# Patient Record
Sex: Male | Born: 1960 | Race: Black or African American | Hispanic: No | Marital: Single | Smoking: Current every day smoker
Health system: Southern US, Community
[De-identification: ages and names within clinical notes are randomized; demographics above are authoritative.]

## PROBLEM LIST (undated history)

## (undated) DIAGNOSIS — F4312 Post-traumatic stress disorder, chronic: Secondary | ICD-10-CM

## (undated) DIAGNOSIS — D509 Iron deficiency anemia, unspecified: Secondary | ICD-10-CM

## (undated) DIAGNOSIS — K279 Peptic ulcer, site unspecified, unspecified as acute or chronic, without hemorrhage or perforation: Secondary | ICD-10-CM

## (undated) DIAGNOSIS — A048 Other specified bacterial intestinal infections: Secondary | ICD-10-CM

## (undated) DIAGNOSIS — M1990 Unspecified osteoarthritis, unspecified site: Secondary | ICD-10-CM

## (undated) DIAGNOSIS — F329 Major depressive disorder, single episode, unspecified: Secondary | ICD-10-CM

## (undated) DIAGNOSIS — F411 Generalized anxiety disorder: Secondary | ICD-10-CM

## (undated) DIAGNOSIS — F1410 Cocaine abuse, uncomplicated: Secondary | ICD-10-CM

## (undated) DIAGNOSIS — I10 Essential (primary) hypertension: Secondary | ICD-10-CM

## (undated) DIAGNOSIS — Z860101 Personal history of adenomatous and serrated colon polyps: Secondary | ICD-10-CM

## (undated) DIAGNOSIS — K449 Diaphragmatic hernia without obstruction or gangrene: Secondary | ICD-10-CM

## (undated) DIAGNOSIS — C61 Malignant neoplasm of prostate: Secondary | ICD-10-CM

## (undated) DIAGNOSIS — K760 Fatty (change of) liver, not elsewhere classified: Secondary | ICD-10-CM

## (undated) DIAGNOSIS — F419 Anxiety disorder, unspecified: Secondary | ICD-10-CM

## (undated) DIAGNOSIS — K219 Gastro-esophageal reflux disease without esophagitis: Secondary | ICD-10-CM

## (undated) DIAGNOSIS — Z8619 Personal history of other infectious and parasitic diseases: Secondary | ICD-10-CM

## (undated) DIAGNOSIS — Z5189 Encounter for other specified aftercare: Secondary | ICD-10-CM

## (undated) DIAGNOSIS — Z8739 Personal history of other diseases of the musculoskeletal system and connective tissue: Secondary | ICD-10-CM

## (undated) DIAGNOSIS — K700 Alcoholic fatty liver: Secondary | ICD-10-CM

## (undated) DIAGNOSIS — D649 Anemia, unspecified: Secondary | ICD-10-CM

## (undated) DIAGNOSIS — F1020 Alcohol dependence, uncomplicated: Secondary | ICD-10-CM

## (undated) HISTORY — PX: OTHER SURGICAL HISTORY: SHX169

## (undated) HISTORY — DX: Anemia, unspecified: D64.9

## (undated) HISTORY — DX: Alcohol dependence, uncomplicated: F10.20

## (undated) HISTORY — PX: TONSILLECTOMY: SUR1361

## (undated) HISTORY — DX: Anxiety disorder, unspecified: F41.9

## (undated) HISTORY — DX: Peptic ulcer, site unspecified, unspecified as acute or chronic, without hemorrhage or perforation: K27.9

## (undated) HISTORY — PX: SKIN BIOPSY: SHX1

---

## 1978-09-06 HISTORY — PX: FINGER SURGERY: SHX640

## 1982-09-06 DIAGNOSIS — Z5189 Encounter for other specified aftercare: Secondary | ICD-10-CM

## 1982-09-06 DIAGNOSIS — Z8711 Personal history of peptic ulcer disease: Secondary | ICD-10-CM

## 1982-09-06 HISTORY — DX: Encounter for other specified aftercare: Z51.89

## 1982-09-06 HISTORY — DX: Personal history of peptic ulcer disease: Z87.11

## 1999-05-07 ENCOUNTER — Ambulatory Visit: Admission: RE | Admit: 1999-05-07 | Discharge: 1999-05-07 | Payer: Self-pay | Admitting: *Deleted

## 2004-09-06 HISTORY — PX: MICROLARYNGOSCOPY: SHX5208

## 2013-02-25 ENCOUNTER — Encounter (HOSPITAL_COMMUNITY): Payer: Self-pay

## 2013-02-25 ENCOUNTER — Emergency Department (HOSPITAL_COMMUNITY)
Admission: EM | Admit: 2013-02-25 | Discharge: 2013-02-25 | Disposition: A | Discharge status: Home / Self Care | Payer: Non-veteran care | Attending: Emergency Medicine | Admitting: Emergency Medicine

## 2013-02-25 DIAGNOSIS — Z79899 Other long term (current) drug therapy: Secondary | ICD-10-CM | POA: Insufficient documentation

## 2013-02-25 DIAGNOSIS — F17200 Nicotine dependence, unspecified, uncomplicated: Secondary | ICD-10-CM | POA: Insufficient documentation

## 2013-02-25 DIAGNOSIS — I10 Essential (primary) hypertension: Secondary | ICD-10-CM | POA: Insufficient documentation

## 2013-02-25 DIAGNOSIS — R319 Hematuria, unspecified: Secondary | ICD-10-CM | POA: Insufficient documentation

## 2013-02-25 HISTORY — DX: Encounter for other specified aftercare: Z51.89

## 2013-02-25 HISTORY — DX: Essential (primary) hypertension: I10

## 2013-02-25 LAB — URINALYSIS, ROUTINE W REFLEX MICROSCOPIC
Bilirubin Urine: NEGATIVE
Glucose, UA: NEGATIVE mg/dL
Hgb urine dipstick: NEGATIVE
Ketones, ur: NEGATIVE mg/dL
Leukocytes, UA: NEGATIVE
Nitrite: NEGATIVE
Protein, ur: NEGATIVE mg/dL
Specific Gravity, Urine: 1.025 (ref 1.005–1.030)
Urobilinogen, UA: 1 mg/dL (ref 0.0–1.0)
pH: 5.5 (ref 5.0–8.0)

## 2013-02-25 LAB — BASIC METABOLIC PANEL
BUN: 14 mg/dL (ref 6–23)
CO2: 25 mEq/L (ref 19–32)
Calcium: 9.3 mg/dL (ref 8.4–10.5)
Chloride: 108 mEq/L (ref 96–112)
Creatinine, Ser: 1.31 mg/dL (ref 0.50–1.35)
GFR calc Af Amer: 70 mL/min — ABNORMAL LOW (ref >90)
GFR calc non Af Amer: 61 mL/min — ABNORMAL LOW (ref >90)
Glucose, Bld: 108 mg/dL — ABNORMAL HIGH (ref 70–99)
Potassium: 4 mEq/L (ref 3.5–5.1)
Sodium: 142 mEq/L (ref 135–145)

## 2013-02-25 LAB — CBC WITH DIFFERENTIAL/PLATELET
Basophils Absolute: 0 10*3/uL (ref 0.0–0.1)
Basophils Relative: 1 % (ref 0–1)
Eosinophils Absolute: 0.1 10*3/uL (ref 0.0–0.7)
Eosinophils Relative: 3 % (ref 0–5)
HCT: 40.9 % (ref 39.0–52.0)
Hemoglobin: 13.4 g/dL (ref 13.0–17.0)
Lymphocytes Relative: 41 % (ref 12–46)
Lymphs Abs: 1.7 10*3/uL (ref 0.7–4.0)
MCH: 27.5 pg (ref 26.0–34.0)
MCHC: 32.8 g/dL (ref 30.0–36.0)
MCV: 84 fL (ref 78.0–100.0)
Monocytes Absolute: 0.5 10*3/uL (ref 0.1–1.0)
Monocytes Relative: 11 % (ref 3–12)
Neutro Abs: 1.9 10*3/uL (ref 1.7–7.7)
Neutrophils Relative %: 45 % (ref 43–77)
Platelets: 162 10*3/uL (ref 150–400)
RBC: 4.87 MIL/uL (ref 4.22–5.81)
RDW: 15 % (ref 11.5–15.5)
WBC: 4.3 10*3/uL (ref 4.0–10.5)

## 2013-02-25 MED ORDER — METOPROLOL TARTRATE 25 MG PO TABS
50.0000 mg | ORAL_TABLET | Freq: Once | ORAL | Status: AC
  Administered 2013-02-25: 50 mg via ORAL
  Filled 2013-02-25: qty 2

## 2013-02-25 MED ORDER — AMLODIPINE BESYLATE 10 MG PO TABS
10.0000 mg | ORAL_TABLET | Freq: Once | ORAL | Status: AC
  Administered 2013-02-25: 10 mg via ORAL
  Filled 2013-02-25: qty 1

## 2013-02-25 MED ORDER — METOPROLOL TARTRATE 25 MG PO TABS: 25.0000 mg | ORAL_TABLET | Freq: Two times a day (BID) | ORAL | Status: DC

## 2013-02-25 MED ORDER — LISINOPRIL 20 MG PO TABS
20.0000 mg | ORAL_TABLET | Freq: Once | ORAL | Status: AC
  Administered 2013-02-25: 20 mg via ORAL
  Filled 2013-02-25: qty 1

## 2013-02-25 MED ORDER — HYDROCHLOROTHIAZIDE 12.5 MG PO CAPS
12.5000 mg | ORAL_CAPSULE | Freq: Once | ORAL | Status: AC
  Administered 2013-02-25: 12.5 mg via ORAL
  Filled 2013-02-25: qty 1

## 2013-02-25 NOTE — ED Provider Notes (Signed)
History     CSN: [id number]  Arrival date & time 02/25/13  4098   First MD Initiated Contact with Patient 02/25/13 [phone]      Chief Complaint  Patient presents with  . Hematuria    (Consider location/radiation/quality/duration/timing/severity/associated sxs/prior treatment) The history is provided by the patient.  DANIELE YANKOWSKI is a 52 y.o. male history of hypertension with medication noncompliance here presenting with hematuria. He's been taking some energy drinks recently in the last 2 days noticed blood in the tip of his penis when he wakes up. Denies any flank pain or dysuria or hematuria. Denies any fever or abdominal pain. Never had any history of cancer in the past. Denies any drug use and is not sexually active for several months. Denies any history of STDs or any discharge from his penis.    Past Medical History  Diagnosis Date  . Blood transfusion without reported diagnosis   . Hypertension     Past Surgical History  Procedure Laterality Date  . Removal of polyp from vocal cord    . Skin biopsy      mole removed from chin    No family history on file.  History  Substance Use Topics  . Smoking status: Current Every Day Smoker -- 0.50 packs/day    Types: Cigarettes  . Smokeless tobacco: Never Used  . Alcohol Use: 0.6 oz/week    1 Cans of beer per week     Comment: a 32 oz every other day      Review of Systems  Genitourinary:       Blood at tip of penis   All other systems reviewed and are negative.    Allergies  Review of patient's allergies indicates no known allergies.  Home Medications   Current Outpatient Rx  Name  Route  Sig  Dispense  Refill  . amLODipine (NORVASC) 10 MG tablet   Oral   Take 10 mg by mouth daily.         . folic acid (FOLVITE) 1 MG tablet   Oral   Take 1 mg by mouth daily.         . hydrochlorothiazide (MICROZIDE) 12.5 MG capsule   Oral   Take 12.5 mg by mouth daily.         Marland Kitchen lisinopril (PRINIVIL,ZESTRIL)  20 MG tablet   Oral   Take 20 mg by mouth daily.         Marland Kitchen METOPROLOL TARTRATE PO   Oral   Take 0.5 tablets by mouth 2 (two) times daily. Patient is unsure os the mg of this nut thinks it is 50 mg         . vitamin B-12 (CYANOCOBALAMIN) 100 MCG tablet   Oral   Take 50 mcg by mouth daily.           BP 173/112  Pulse 73  Temp(Src) 98.2 F (36.8 C) (Oral)  Resp 18  SpO2 97%  Physical Exam  Nursing note and vitals reviewed. Constitutional: He is oriented to person, place, and time. He appears well-developed and well-nourished.  HENT:  Head: Normocephalic.  Mouth/Throat: Oropharynx is clear and moist.  Eyes: Conjunctivae are normal. Pupils are equal, round, and reactive to light.  Neck: Normal range of motion. Neck supple.  Cardiovascular: Normal rate, regular rhythm and normal heart sounds.   Pulmonary/Chest: Effort normal and breath sounds normal. No respiratory distress. He has no wheezes. He has no rales.  Abdominal: Soft. Bowel sounds are  normal. He exhibits no distension. There is no tenderness. There is no rebound and no guarding.  Genitourinary:  No discharge from penis. Testicles nontender   Musculoskeletal: Normal range of motion.  Neurological: He is alert and oriented to person, place, and time.  Skin: Skin is warm and dry.  Psychiatric: He has a normal mood and affect. His behavior is normal. Judgment and thought content normal.    ED Course  Procedures (including critical care time)  Labs Reviewed  BASIC METABOLIC PANEL - Abnormal; Notable for the following:    Glucose, Bld 108 (*)    GFR calc non Af Amer 61 (*)    GFR calc Af Amer 70 (*)    All other components within normal limits  URINE CULTURE  URINALYSIS, ROUTINE W REFLEX MICROSCOPIC  CBC WITH DIFFERENTIAL   No results found.   No diagnosis found.    MDM  ROBET CRUTCHFIELD is a 52 y.o. male here with blood at tip of penis. Given drinking energy drinks will get Cr. Will check UA for  infection. Given no pain so I doubt kidney stones.   12:07 PM Cr nl. UA showed no hematuria. Hypertensive initially but improved with PO meds. I told him to take his meds as prescribed and f/u with urology with further workup for hematuria.        Richardean Canal, MD 02/25/13 1209

## 2013-02-25 NOTE — ED Notes (Signed)
Patient had blood in his urine Saturday. States that the only thing he did new was drink 2 energy drinks. Patient states that he has not had any problems urinating. No pain with urination.

## 2013-02-25 NOTE — ED Notes (Addendum)
Denies  Having HA. Chest discomfort 2/10. MD Silverio Lay aware

## 2013-02-26 LAB — URINE CULTURE
Colony Count: NO GROWTH
Culture: NO GROWTH

## 2020-10-31 LAB — BASIC METABOLIC PANEL
Creatinine: 1.3 (ref 0.6–1.3)
Glucose: 112
Sodium: 138 (ref 137–147)

## 2020-10-31 LAB — HEPATIC FUNCTION PANEL
ALT: 43 — AB (ref 10–40)
AST: 30 (ref 14–40)
Bilirubin, Total: 0.5

## 2020-10-31 LAB — CBC AND DIFFERENTIAL
HCT: 43 (ref 41–53)
Hemoglobin: 13.9 (ref 13.5–17.5)
Platelets: 192 (ref 150–399)

## 2020-12-29 ENCOUNTER — Encounter: Payer: Self-pay | Admitting: Nurse Practitioner

## 2020-12-29 DIAGNOSIS — F411 Generalized anxiety disorder: Secondary | ICD-10-CM | POA: Insufficient documentation

## 2020-12-29 DIAGNOSIS — F17200 Nicotine dependence, unspecified, uncomplicated: Secondary | ICD-10-CM | POA: Insufficient documentation

## 2020-12-29 DIAGNOSIS — Z8711 Personal history of peptic ulcer disease: Secondary | ICD-10-CM | POA: Insufficient documentation

## 2020-12-29 DIAGNOSIS — M109 Gout, unspecified: Secondary | ICD-10-CM | POA: Insufficient documentation

## 2020-12-29 DIAGNOSIS — K219 Gastro-esophageal reflux disease without esophagitis: Secondary | ICD-10-CM | POA: Insufficient documentation

## 2020-12-29 DIAGNOSIS — I10 Essential (primary) hypertension: Secondary | ICD-10-CM | POA: Insufficient documentation

## 2020-12-29 DIAGNOSIS — E559 Vitamin D deficiency, unspecified: Secondary | ICD-10-CM | POA: Insufficient documentation

## 2020-12-29 DIAGNOSIS — E785 Hyperlipidemia, unspecified: Secondary | ICD-10-CM | POA: Insufficient documentation

## 2020-12-29 DIAGNOSIS — F339 Major depressive disorder, recurrent, unspecified: Secondary | ICD-10-CM | POA: Insufficient documentation

## 2020-12-29 DIAGNOSIS — K30 Functional dyspepsia: Secondary | ICD-10-CM | POA: Insufficient documentation

## 2021-01-13 ENCOUNTER — Other Ambulatory Visit: Payer: Self-pay

## 2021-01-13 DIAGNOSIS — G4700 Insomnia, unspecified: Secondary | ICD-10-CM | POA: Insufficient documentation

## 2021-01-13 DIAGNOSIS — R69 Illness, unspecified: Secondary | ICD-10-CM | POA: Insufficient documentation

## 2021-01-13 DIAGNOSIS — D509 Iron deficiency anemia, unspecified: Secondary | ICD-10-CM | POA: Insufficient documentation

## 2021-01-13 DIAGNOSIS — F1020 Alcohol dependence, uncomplicated: Secondary | ICD-10-CM | POA: Insufficient documentation

## 2021-01-13 DIAGNOSIS — F1411 Cocaine abuse, in remission: Secondary | ICD-10-CM | POA: Insufficient documentation

## 2021-01-13 DIAGNOSIS — B351 Tinea unguium: Secondary | ICD-10-CM | POA: Insufficient documentation

## 2021-01-13 NOTE — Progress Notes (Deleted)
01/13/2021 [name] [id number] [birth year]   CHIEF COMPLAINT:   HISTORY OF PRESENT ILLNESS: Dellis Filbert L.Jolliff is a 60 year old male with a past medical history of anxiety, depression, hypertension, gout, GERD, bleeding ulcers 1985 and 1996 years ago,   He presents to our office today as referred by his [place] physician  He drinks 6 to 7 beers daily. Alcoholism 25+.   Melena for 3 to 4 days   Not taking iron.   [facility] times  NO dysphagia or heartburn. Upper abd pain, a few time recently took Omep and milk 3 times past month. No lower abd pain. Normal brown formed stool. No red blood.  Stool dark after chicken liver not black.  Never had a colonoscopy  Gout No NSAIDs.    Past Medical History:  Diagnosis Date  . Blood transfusion without reported diagnosis   . Hypertension    Past Surgical History:  Procedure Laterality Date  . removal of polyp from vocal cord    . SKIN BIOPSY     mole removed from chin                                                                                                                                                                                          +  Social History: cocaine few times weekly x 30 years. Last used cocaine one week. Rehab 08/2020 out 09/13/2020. 30 days. Now once weekly. Not in outpatient rehab.   Family History: Father died age 8 pancreatic cancer. Mother age 49 heart disease, diabetes.    reports that he has been smoking cigarettes. He has been smoking about 0.50 packs per day. He has never used smokeless tobacco. He reports current alcohol use of about 1.0 standard drink of alcohol per week. He reports that he does not use drugs. family history is not on file. No Known Allergies    Outpatient Encounter Medications as of 01/14/2021   Medication Sig  . amLODipine (NORVASC) 10 MG tablet Take 10 mg by mouth daily.  . Cholecalciferol 50 MCG (2000 UT) TABS Take 1 tablet by mouth 2 (two) times daily.  . cloNIDine (CATAPRES) 0.1 MG tablet TAKE ONE TABLET BY MOUTH TWICE A DAY FOR BLOOD PRESSURE  . folic acid (FOLVITE) 1 MG tablet Take 1 mg by mouth daily.  . hydrochlorothiazide (MICROZIDE) 12.5 MG capsule Take 12.5 mg by mouth daily.  Marland Kitchen lisinopril (PRINIVIL,ZESTRIL) 20 MG tablet Take 20 mg by mouth daily.  . metoprolol (LOPRESSOR) 25 MG tablet Take 1 tablet (25 mg total) by mouth 2 (two) times daily.  Marland Kitchen METOPROLOL TARTRATE PO Take 0.5 tablets by  mouth 2 (two) times daily. Patient is unsure os the mg of this nut thinks it is 50 mg  . naltrexone (DEPADE) 50 MG tablet TAKE ONE TABLET BY MOUTH DAILY FOR ALCOHOL CRAVINGS  . sertraline (ZOLOFT) 100 MG tablet TAKE ONE-HALF TABLET BY MOUTH IN THE MORNING FOR MOOD AND ANXIETY  . traZODone (DESYREL) 50 MG tablet TAKE ONE TABLET BY MOUTH AT BEDTIME AS NEEDED FOR DIFFICULTY SLEEPING  . vitamin B-12 (CYANOCOBALAMIN) 100 MCG tablet Take 50 mcg by mouth daily.   No facility-administered encounter medications on file as of 01/14/2021.     REVIEW OF SYSTEMS:  Gen: Denies fever, sweats or chills. No weight loss.  CV: Denies chest pain, palpitations or edema. Resp: Denies cough, shortness of breath of hemoptysis.  GI: Denies heartburn, dysphagia, stomach or lower abdominal pain. No diarrhea or constipation.  GU : Denies urinary burning, blood in urine, increased urinary frequency or incontinence. MS: Denies joint pain, muscles aches or weakness. Derm: Denies rash, itchiness, skin lesions or unhealing ulcers. Psych: Denies depression, anxiety, memory loss, suicidal ideation and confusion. Heme: Denies bruising, bleeding. Neuro:  Denies headaches, dizziness or paresthesias. Endo:  Denies any problems with DM, thyroid or adrenal function.    PHYSICAL EXAM: There were no vitals taken for  this visit. General: Well developed ... in no acute distress. Head: Normocephalic and atraumatic. Eyes:  Sclerae non-icteric, conjunctive pink. Ears: Normal auditory acuity. Mouth: Dentition intact. No ulcers or lesions.  Neck: Supple, no lymphadenopathy or thyromegaly.  Lungs: Clear bilaterally to auscultation without wheezes, crackles or rhonchi. Heart: Regular rate and rhythm. No murmur, rub or gallop appreciated.  Abdomen: Soft, nontender, non distended. No masses. No hepatosplenomegaly. Normoactive bowel sounds x 4 quadrants.  Rectal:  Musculoskeletal: Symmetrical with no gross deformities. Skin: Warm and dry. No rash or lesions on visible extremities. Extremities: No edema. Neurological: Alert oriented x 4, no focal deficits.  Psychological:  Alert and cooperative. Normal mood and affect.  ASSESSMENT AND PLAN:    CC:  Kerin Perna, NP

## 2021-01-14 ENCOUNTER — Other Ambulatory Visit: Payer: Self-pay

## 2021-01-14 ENCOUNTER — Encounter: Payer: Self-pay | Admitting: Nurse Practitioner

## 2021-01-14 ENCOUNTER — Other Ambulatory Visit (INDEPENDENT_AMBULATORY_CARE_PROVIDER_SITE_OTHER): Payer: No Typology Code available for payment source

## 2021-01-14 ENCOUNTER — Ambulatory Visit (INDEPENDENT_AMBULATORY_CARE_PROVIDER_SITE_OTHER): Payer: No Typology Code available for payment source | Admitting: Nurse Practitioner

## 2021-01-14 VITALS — BP 140/80 | HR 84 | Ht 69.0 in | Wt 193.1 lb

## 2021-01-14 DIAGNOSIS — D509 Iron deficiency anemia, unspecified: Secondary | ICD-10-CM | POA: Diagnosis not present

## 2021-01-14 DIAGNOSIS — Z8711 Personal history of peptic ulcer disease: Secondary | ICD-10-CM | POA: Diagnosis not present

## 2021-01-14 LAB — CBC
HCT: 42 % (ref 39.0–52.0)
Hemoglobin: 14.2 g/dL (ref 13.0–17.0)
MCHC: 33.7 g/dL (ref 30.0–36.0)
MCV: 83.3 fl (ref 78.0–100.0)
Platelets: 195 10*3/uL (ref 150.0–400.0)
RBC: 5.05 Mil/uL (ref 4.22–5.81)
RDW: 15.9 % — ABNORMAL HIGH (ref 11.5–15.5)
WBC: 4.3 10*3/uL (ref 4.0–10.5)

## 2021-01-14 MED ORDER — PEG 3350-KCL-NA BICARB-NACL 420 G PO SOLR: 4000.0000 mL | Freq: Once | ORAL | 0 refills | Status: AC

## 2021-01-14 NOTE — Patient Instructions (Addendum)
If you are age 62 or younger, your body mass index should be between 19-25. Your Body mass index is 28.52 kg/m. If this is out of the aformentioned range listed, please consider follow up with your Primary Care Provider.   LABS:  Lab work has been ordered for you today. Our lab is located in the basement. Press "B" on the elevator. The lab is located at the first door on the left as you exit the elevator.  PROCEDURES: You have been scheduled for an EGD and Colonoscopy. Please follow the written instructions given to you at your visit today. Please pick up your prep supplies at the pharmacy within the next 1-3 days. If you use inhalers (even only as needed), please bring them with you on the day of your procedure.   It was great seeing you today! Thank you for entrusting me with your care and choosing [facility].  Noralyn Pick, CRNP

## 2021-01-14 NOTE — Progress Notes (Signed)
01/14/2021 [name] [id number] 04/23/1961   CHIEF COMPLAINT: Schedule an EGD and colonoscopy  HISTORY OF PRESENT ILLNESS:  Arthur Craig is a 60 year old male with a past medical history of anxiety, depression, alcoholism on Naltrexone and cocaine use, hypertension, gout, anemia, GERD and bleeding ulcers 1985 and 1996. He presents to our office today [facility].  He complains of having epigastric pain which comes and goes for the past month.  He takes Omeprazole infrequently and often drinks milk when he has stomach pain.  He denies having any dysphagia or heartburn.  He denies NSAID use.  He drinks 6-7 beers daily for the past 25+ years and he has been in several our rehab programs with relapse.  He uses cocaine once weekly, he has intentions to remain drug-free at this time.  He last used cocaine 1 week ago.  He is passing a normal formed brown bowel movement daily.  No rectal bleeding or recent melena.  About three weeks ago he passed a dark brown stool, not black which he attributed to eating chicken livers.  He stated he was scheduled for an EGD and colonoscopy several times at the [place] facility but his procedures were canceled due to the [place] pandemic.  No known family history of esophageal, gastric or colon cancer.  Father with history of colon polyps.  He reports having a history of anemia for which he was previously prescribed oral iron by his PCP which he is not taking.  Labs from the [place] 10/31/2020: WBC 3.41.  Hemoglobin 13.9.  Hematocrit 42.7.  Platelet 192.  AST 30.  ALT 43.  Total bili 0.5.  Creatinine 1.25.  Glucose 112.  Potassium 3.6.  Sodium 138.  Past Medical History:  Diagnosis Date  . Alcoholism ([place])   . Anemia   . Anxiety   . Blood transfusion without reported diagnosis   . Hypertension   . Peptic ulcer    Past Surgical History:  Procedure Laterality Date  . FINGER SURGERY Right    index  . removal of polyp from  vocal cord    . SKIN BIOPSY     mole removed from chin  . TONSILLECTOMY     Social History: He is single.  He has 1 son and 1 daughter.  He is unemployed.  He smokes cigarettes 1/2 pack/day.  He drinks 6-7 beers daily for the past 25 years.  He uses cocaine once weekly.  Family History: family history includes Breast cancer in his sister; Diabetes in his father, mother, sister, and another family member; Esophageal cancer in his maternal uncle; Hyperlipidemia in an other family member; Hypertension in his sister and another family member; Pancreatic cancer in his father; Stroke in an other family member.   No Known Allergies    Outpatient Encounter Medications as of 01/14/2021  Medication Sig  . amLODipine (NORVASC) 10 MG tablet Take 10 mg by mouth daily.  . Cholecalciferol 50 MCG (2000 UT) TABS Take 1 tablet by mouth 2 (two) times daily.  . cloNIDine (CATAPRES) 0.1 MG tablet Take 0.1 mg by mouth daily.  . folic acid (FOLVITE) 1 MG tablet Take 1 mg by mouth daily.  . hydrochlorothiazide (MICROZIDE) 12.5 MG capsule Take 12.5 mg by mouth daily.  Marland Kitchen lisinopril (PRINIVIL,ZESTRIL) 20 MG tablet Take 20 mg by mouth daily.  . naltrexone (DEPADE) 50 MG tablet Take 50 mg by mouth daily.  . polyethylene glycol-electrolytes (NULYTELY) 420 g solution  Take 4,000 mLs by mouth once for 1 dose.  . sertraline (ZOLOFT) 100 MG tablet Take 100 mg by mouth as needed.  . traZODone (DESYREL) 50 MG tablet Take 50 mg by mouth at bedtime as needed.  . vitamin B-12 (CYANOCOBALAMIN) 100 MCG tablet Take 50 mcg by mouth daily.  . [DISCONTINUED] metoprolol (LOPRESSOR) 25 MG tablet Take 1 tablet (25 mg total) by mouth 2 (two) times daily.  . [DISCONTINUED] METOPROLOL TARTRATE PO Take 0.5 tablets by mouth 2 (two) times daily. Patient is unsure os the mg of this nut thinks it is 50 mg   No facility-administered encounter medications on file as of 01/14/2021.   REVIEW OF SYSTEMS:  Gen: Denies fever, sweats or chills. No  weight loss.  CV: Denies chest pain, palpitations or edema. Resp: Denies cough, shortness of breath of hemoptysis.  GI: See HPI.  GU : Denies urinary burning, blood in urine, increased urinary frequency or incontinence. MS: Denies joint pain, muscles aches or weakness. Derm: Denies rash, itchiness, skin lesions or unhealing ulcers. Psych: + Anxiety.  Heme: Denies bruising, bleeding. Neuro:  Denies headaches, dizziness or paresthesias. Endo:  Denies any problems with DM, thyroid or adrenal function.   PHYSICAL EXAM: BP 140/80 (BP Location: Left Arm, Patient Position: Sitting, Cuff Size: Normal)   Pulse 84   Ht 5\' 9"  (1.753 m) Comment: height measured without shoes  Wt 193 lb 2 oz (87.6 kg)   BMI 28.52 kg/m  General: 60 year old male in no acute distress. Head: Normocephalic and atraumatic. Eyes:  Sclerae non-icteric, conjunctive pink. Ears: Normal auditory acuity. Mouth: Dentition intact. No ulcers or lesions.  Neck: Supple, no lymphadenopathy or thyromegaly.  Lungs: Clear bilaterally to auscultation without wheezes, crackles or rhonchi. Heart: Regular rate and rhythm. No murmur, rub or gallop appreciated.  Abdomen: Soft, nontender, non distended. No masses. No hepatosplenomegaly. Normoactive bowel sounds x 4 quadrants.  Rectal: Deferred.  Musculoskeletal: Symmetrical with no gross deformities. Skin: Warm and dry. No rash or lesions on visible extremities. Extremities: No edema. Neurological: Alert oriented x 4, no focal deficits.  Psychological:  Alert and cooperative. Normal mood and affect.  ASSESSMENT AND PLAN:  76.  60 year old male with a history of bleeding gastric ulcers in 1985 and 1996 presents with epigastric pain for the past month.  -Omeprazole daily as previously prescribed by PCP -EGD benefits and risks discussed including risk with sedation, risk of bleeding, perforation and infection   2.  History of iron deficiency anemia -CBC, iron, iron saturation, TIBC  and ferritin level today -EGD and colonoscopy benefits and risks discussed including risk with sedation, risk of bleeding, perforation and infection   3.  History of alcoholism on Naltrexone -Commended no alcohol, continue rehab  4.  Cocaine use once weekly -No cocaine use recommend -Patient informed he must remain cocaine/drug free for at least 1 week prior to his EGD and colonoscopy  Further recommendations to be determined after the above evaluation completed            CC:  Kerin Perna, NP

## 2021-01-15 LAB — IRON,TIBC AND FERRITIN PANEL
%SAT: 37 % (calc) (ref 20–48)
Ferritin: 69 ng/mL (ref 24–380)
Iron: 145 ug/dL (ref 50–180)
TIBC: 394 mcg/dL (calc) (ref 250–425)

## 2021-01-15 MED ORDER — PEG 3350-KCL-NA BICARB-NACL 420 G PO SOLR: 4000.0000 mL | Freq: Once | ORAL | 0 refills | Status: AC

## 2021-01-15 NOTE — Progress Notes (Signed)
Attending Physician's Attestation   I have reviewed the chart.   I agree with the Advanced Practitioner's note, impression, and recommendations with any updates as below.    Damichael Hofman Mansouraty, MD [facility]oscopy Office # [phone]  

## 2021-01-15 NOTE — Addendum Note (Signed)
Addended by: Cardell Peach I on: 01/15/2021 09:05 AM   Modules accepted: Orders

## 2021-01-16 ENCOUNTER — Telehealth: Payer: Self-pay

## 2021-01-16 ENCOUNTER — Telehealth: Payer: Self-pay | Admitting: Nurse Practitioner

## 2021-01-16 NOTE — Telephone Encounter (Signed)
Spoke to patient and let him know the only prep [facility] covers is the Golytely but they do not have it available and could not give me a time frame on when it would be.  Since they do not pay for other prep I told him about the miralax/gatorade prep and that I would mail him the instructions.  Patient expressed understanding and agreement.

## 2021-01-16 NOTE — Telephone Encounter (Signed)
Notified patient of test results from [facility] , patient expressed understanding and agreement, no further questions at this time.

## 2021-01-16 NOTE — Telephone Encounter (Signed)
[facility] will only pay for Golytely. I will mail him the Miralax Gatorade instructions.  I called him and left a message to call me back.

## 2021-01-16 NOTE — Telephone Encounter (Addendum)
-----   Message from Noralyn Pick, NP sent at 01/15/2021  5:11 AM EDT ----- Lenna Sciara, pls inform patient his iron levels are normal. Sincerely, [facility]    [place], Patrecia Pour, NP  Cardell Peach I, CMA Pls inform the patient his CBC was normal. Iron levels pending. Thx

## 2021-01-16 NOTE — Telephone Encounter (Signed)
Patient called said the [facility] does not have the Golytely medication and he needs another medication sent to them. Please call patient to advise once done so he is aware and he also asked to leave a detailed message if he does not get to answer the call.

## 2021-03-16 ENCOUNTER — Telehealth: Payer: Self-pay | Admitting: Gastroenterology

## 2021-03-16 NOTE — Telephone Encounter (Signed)
Understood and thank you for the updated rescheduled visit. No late cancellation fee for this first cancellation but if he cancels again, we will need to assess for this. Thank you. GM

## 2021-03-16 NOTE — Telephone Encounter (Signed)
Hi Dr. Rush Landmark, this patient just called to cancel procedure that was scheduled for tomorrow because he feels sick. Patient has rescheduled to 04/10/21. Thank you.

## 2021-03-17 ENCOUNTER — Encounter: Payer: Non-veteran care | Admitting: Gastroenterology

## 2021-04-10 ENCOUNTER — Other Ambulatory Visit: Payer: Self-pay

## 2021-04-10 ENCOUNTER — Encounter: Payer: Self-pay | Admitting: Gastroenterology

## 2021-04-10 ENCOUNTER — Ambulatory Visit (AMBULATORY_SURGERY_CENTER): Payer: No Typology Code available for payment source | Admitting: Gastroenterology

## 2021-04-10 VITALS — BP 116/69 | HR 62 | Temp 96.0°F | Resp 21 | Ht 69.0 in | Wt 193.0 lb

## 2021-04-10 DIAGNOSIS — K449 Diaphragmatic hernia without obstruction or gangrene: Secondary | ICD-10-CM | POA: Diagnosis not present

## 2021-04-10 DIAGNOSIS — D127 Benign neoplasm of rectosigmoid junction: Secondary | ICD-10-CM | POA: Diagnosis not present

## 2021-04-10 DIAGNOSIS — D123 Benign neoplasm of transverse colon: Secondary | ICD-10-CM | POA: Diagnosis not present

## 2021-04-10 DIAGNOSIS — Z1211 Encounter for screening for malignant neoplasm of colon: Secondary | ICD-10-CM

## 2021-04-10 DIAGNOSIS — K31A0 Gastric intestinal metaplasia, unspecified: Secondary | ICD-10-CM | POA: Diagnosis not present

## 2021-04-10 DIAGNOSIS — K2980 Duodenitis without bleeding: Secondary | ICD-10-CM | POA: Diagnosis not present

## 2021-04-10 DIAGNOSIS — K2951 Unspecified chronic gastritis with bleeding: Secondary | ICD-10-CM | POA: Diagnosis not present

## 2021-04-10 DIAGNOSIS — B9681 Helicobacter pylori [H. pylori] as the cause of diseases classified elsewhere: Secondary | ICD-10-CM | POA: Diagnosis not present

## 2021-04-10 DIAGNOSIS — D509 Iron deficiency anemia, unspecified: Secondary | ICD-10-CM

## 2021-04-10 DIAGNOSIS — K2970 Gastritis, unspecified, without bleeding: Secondary | ICD-10-CM | POA: Diagnosis not present

## 2021-04-10 DIAGNOSIS — K3189 Other diseases of stomach and duodenum: Secondary | ICD-10-CM

## 2021-04-10 HISTORY — PX: COLONOSCOPY WITH ESOPHAGOGASTRODUODENOSCOPY (EGD): SHX5779

## 2021-04-10 MED ORDER — OMEPRAZOLE 40 MG PO CPDR: 40.0000 mg | DELAYED_RELEASE_CAPSULE | Freq: Every day | ORAL | 3 refills | Status: DC

## 2021-04-10 MED ORDER — SODIUM CHLORIDE 0.9 % IV SOLN: 500.0000 mL | Freq: Once | INTRAVENOUS | Status: DC

## 2021-04-10 NOTE — Patient Instructions (Signed)
Handout on polyps, high fiber and hemorrhoids given.  Use preparation H as needed.    YOU HAD AN ENDOSCOPIC PROCEDURE TODAY AT [facility]:   Refer to the procedure report that was given to you for any specific questions about what was found during the examination.  If the procedure report does not answer your questions, please call your gastroenterologist to clarify.  If you requested that your care partner not be given the details of your procedure findings, then the procedure report has been included in a sealed envelope for you to review at your convenience later.  YOU SHOULD EXPECT: Some feelings of bloating in the abdomen. Passage of more gas than usual.  Walking can help get rid of the air that was put into your GI tract during the procedure and reduce the bloating. If you had a lower endoscopy (such as a colonoscopy or flexible sigmoidoscopy) you may notice spotting of blood in your stool or on the toilet paper. If you underwent a bowel prep for your procedure, you may not have a normal bowel movement for a few days.  Please Note:  You might notice some irritation and congestion in your nose or some drainage.  This is from the oxygen used during your procedure.  There is no need for concern and it should clear up in a day or so.  SYMPTOMS TO REPORT IMMEDIATELY:  Following lower endoscopy (colonoscopy or flexible sigmoidoscopy):  Excessive amounts of blood in the stool  Significant tenderness or worsening of abdominal pains  Swelling of the abdomen that is new, acute  Fever of 100F or higher  Following upper endoscopy (EGD)  Vomiting of blood or coffee ground material  New chest pain or pain under the shoulder blades  Painful or persistently difficult swallowing  New shortness of breath  Fever of 100F or higher  Black, tarry-looking stools  For urgent or emergent issues, a gastroenterologist can be reached at any hour by calling [phone]. Do not use MyChart  messaging for urgent concerns.    DIET:  We do recommend a small meal at first, but then you may proceed to your regular diet.  Drink plenty of fluids but you should avoid alcoholic beverages for 24 hours.  ACTIVITY:  You should plan to take it easy for the rest of today and you should NOT DRIVE or use heavy machinery until tomorrow (because of the sedation medicines used during the test).    FOLLOW UP: Our staff will call the number listed on your records 48-72 hours following your procedure to check on you and address any questions or concerns that you may have regarding the information given to you following your procedure. If we do not reach you, we will leave a message.  We will attempt to reach you two times.  During this call, we will ask if you have developed any symptoms of COVID 19. If you develop any symptoms (ie: fever, flu-like symptoms, shortness of breath, cough etc.) before then, please call [phone].  If you test positive for Covid 19 in the 2 weeks post procedure, please call and report this information to [place].    If any biopsies were taken you will be contacted by phone or by letter within the next 1-3 weeks.  Please call us at [phone] if you have not heard about the biopsies in 3 weeks.    SIGNATURES/CONFIDENTIALITY: You and/or your care partner have signed paperwork which will be entered into your electronic  medical record.  These signatures attest to the fact that that the information above on your After Visit Summary has been reviewed and is understood.  Full responsibility of the confidentiality of this discharge information lies with you and/or your care-partner.  

## 2021-04-10 NOTE — Progress Notes (Signed)
Called to room to assist during endoscopic procedure.  Patient ID and intended procedure confirmed with present staff. Received instructions for my participation in the procedure from the performing physician.  

## 2021-04-10 NOTE — Op Note (Signed)
[facility] Patient Name: Arthur Craig Procedure Date: 04/10/2021 8:12 AM MRN: [id number] Endoscopist: Justice Britain , MD Age: 60 Referring MD:  Date of Birth: 03-24-1961 Gender: Male Account #: [id number] Procedure:                Upper GI endoscopy Indications:              Epigastric abdominal pain, Iron deficiency anemia,                            Exclusion of gastric ulcer - prior history of                            bleeding gastric ulcer Medicines:                Monitored Anesthesia Care Procedure:                Pre-Anesthesia Assessment:                           - Prior to the procedure, a History and Physical                            was performed, and patient medications and                            allergies were reviewed. The patient's tolerance of                            previous anesthesia was also reviewed. The risks                            and benefits of the procedure and the sedation                            options and risks were discussed with the patient.                            All questions were answered, and informed consent                            was obtained. Prior Anticoagulants: The patient has                            taken no previous anticoagulant or antiplatelet                            agents. ASA Grade Assessment: II - A patient with                            mild systemic disease. After reviewing the risks                            and benefits, the patient was deemed in  satisfactory condition to undergo the procedure.                           After obtaining informed consent, the endoscope was                            passed under direct vision. Throughout the                            procedure, the patient's blood pressure, pulse, and                            oxygen saturations were monitored continuously. The                            Endoscope was introduced through  the mouth, and                            advanced to the second part of duodenum. The upper                            GI endoscopy was accomplished without difficulty.                            The patient tolerated the procedure. Scope In: Scope Out: Findings:                 White nummular lesions were noted at the esophageal                            anastomosis. Biopsies were taken with a cold                            forceps for histology.                           The Z-line was irregular and was found 36 cm from                            the incisors.                           A 2 cm hiatal hernia was present.                           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                           Patchy mildly erythematous mucosa without bleeding                            was found in the rest of the entire examined  stomach. Biopsies were taken with a cold forceps                            for histology and Helicobacter pylori testing.                           Localized moderately erythematous mucosa without                            active bleeding and with no stigmata of bleeding                            was found in the duodenal bulb.                           No other gross lesions were noted in the duodenal                            bulb, in the first portion of the duodenum and in                            the second portion of the duodenum. Biopsies were                            taken with a cold forceps for histology. Complications:            No immediate complications. Estimated Blood Loss:     Estimated blood loss was minimal. Impression:               - White nummular lesions in esophageal mucosa.                            Biopsied.                           - Z-line irregular, 36 cm from the incisors.                           - 2 cm hiatal hernia.                            - Possible mild portal hypertensive gastropathy in                            body.                           - Erythematous mucosa in the entire stomach.                            Biopsied.                           - Erythematous duodenopathy in the bulb. No other  gross lesions in the duodenal bulb, in the first                            portion of the duodenum and in the second portion                            of the duodenum. Biopsied. Recommendation:           - Proceed to scheduled colonoscopy.                           - Initiate Omeprazole 40 mg daily.                           - Observe patient's clinical course.                           - Await pathology results.                           - Recommend an Abdominal U/S to evaluate                            Liver/Spleen in setting of possible portal                            gastropathy to further clarify underlying liver                            disease or not.                           - Recommend continued abstinence as able -                            Congratulations on what you have accomplished so                            far.                           - The findings and recommendations were discussed                            with the patient. Justice Britain, MD 04/10/2021 8:54:10 AM

## 2021-04-10 NOTE — Op Note (Signed)
[facility] Patient Name: Arthur Craig Procedure Date: 04/10/2021 8:13 AM MRN: [id number] Endoscopist: Justice Britain , MD Age: 60 Referring MD:  Date of Birth: 01/14/1961 Gender: Male Account #: [id number] Procedure:                Colonoscopy Indications:              Screening for colorectal malignant neoplasm Medicines:                Monitored Anesthesia Care Procedure:                Pre-Anesthesia Assessment:                           - Prior to the procedure, a History and Physical                            was performed, and patient medications and                            allergies were reviewed. The patient's tolerance of                            previous anesthesia was also reviewed. The risks                            and benefits of the procedure and the sedation                            options and risks were discussed with the patient.                            All questions were answered, and informed consent                            was obtained. Prior Anticoagulants: The patient has                            taken no previous anticoagulant or antiplatelet                            agents. ASA Grade Assessment: II - A patient with                            mild systemic disease. After reviewing the risks                            and benefits, the patient was deemed in                            satisfactory condition to undergo the procedure.                           After obtaining informed consent, the colonoscope  was passed under direct vision. Throughout the                            procedure, the patient's blood pressure, pulse, and                            oxygen saturations were monitored continuously. The                            Olympus CF-HQ190L [id number]) Colonoscope was                            introduced through the anus and advanced to the the                            cecum, identified  by appendiceal orifice and                            ileocecal valve. The colonoscopy was somewhat                            difficult due to a tortuous colon. Successful                            completion of the procedure was aided by changing                            the patient's position, using manual pressure,                            withdrawing and reinserting the scope,                            straightening and shortening the scope to obtain                            bowel loop reduction and using scope torsion. The                            patient tolerated the procedure. The quality of the                            bowel preparation was adequate. The ileocecal                            valve, appendiceal orifice, and rectum were                            photographed. Scope In: 8:26:57 AM Scope Out: 8:45:19 AM Scope Withdrawal Time: 0 hours 11 minutes 18 seconds  Total Procedure Duration: 0 hours 18 minutes 22 seconds  Findings:                 The digital rectal exam findings include  hemorrhoids. Pertinent negatives include no                            palpable rectal lesions.                           The left colon was grossly tortuous.                           Four sessile polyps were found in the recto-sigmoid                            colon (3) and transverse colon (1). The polyps were                            2 to 5 mm in size. These polyps were removed with a                            cold snare. Resection and retrieval were complete.                           Normal mucosa was found in the entire colon                            otherwise.                           Non-bleeding non-thrombosed external and internal                            hemorrhoids were found during retroflexion, during                            perianal exam and during digital exam. The                            hemorrhoids were Grade II  (internal hemorrhoids                            that prolapse but reduce spontaneously). Complications:            No immediate complications. Estimated Blood Loss:     Estimated blood loss was minimal. Impression:               - Hemorrhoids found on digital rectal exam.                           - Tortuous colon.                           - Four 2 to 5 mm polyps at the recto-sigmoid colon                            and in the transverse colon, removed with a cold  snare. Resected and retrieved.                           - Normal mucosa in the entire examined colon                            otherwise.                           - Non-bleeding non-thrombosed external and internal                            hemorrhoids. Recommendation:           - The patient will be observed post-procedure,                            until all discharge criteria are met.                           - Discharge patient to home.                           - Patient has a contact number available for                            emergencies. The signs and symptoms of potential                            delayed complications were discussed with the                            patient. Return to normal activities tomorrow.                            Written discharge instructions were provided to the                            patient.                           - High fiber diet.                           - Use FiberCon 1-2 tablets PO daily.                           - Continue present medications.                           - Await pathology results.                           - Repeat colonoscopy in 3/01/10/09 years for                            surveillance based on pathology results. Consider  Pediatric colonoscope for next procedure due to                            tortuosity of the left colon.                           - The findings and recommendations  were discussed                            with the patient.                           - Consider Preparation H as needed for rectal                            bleeding. If issues persist then will consider                            Anusol suppositories. Justice Britain, MD 04/10/2021 8:58:49 AM

## 2021-04-10 NOTE — Progress Notes (Signed)
Sedated VSS NAD Report to RN

## 2021-04-14 ENCOUNTER — Telehealth: Payer: Self-pay

## 2021-04-14 NOTE — Telephone Encounter (Signed)
  Follow up Call-  Call back number 04/10/2021  Post procedure Call Back phone  # [phone]  Permission to leave phone message Yes  Some recent data might be hidden     Patient questions:  Do you have a fever, pain , or abdominal swelling? No. Pain Score  0 *  Have you tolerated food without any problems? Yes.    Have you been able to return to your normal activities? Yes.    Do you have any questions about your discharge instructions: Diet   No. Medications  No. Follow up visit  No.  Do you have questions or concerns about your Care? No.  Actions: * If pain score is 4 or above: No action needed, pain <4.  Have you developed a fever since your procedure? No   2.   Have you had an respiratory symptoms (SOB or cough) since your procedure? No   3.   Have you tested positive for COVID 19 since your procedure no   4.   Have you had any family members/close contacts diagnosed with the COVID 19 since your procedure?  No    If yes to any of these questions please route to Joylene John, RN and Joella Prince, RN

## 2021-04-16 ENCOUNTER — Other Ambulatory Visit: Payer: Self-pay

## 2021-04-16 ENCOUNTER — Encounter: Payer: Self-pay | Admitting: Gastroenterology

## 2021-04-16 ENCOUNTER — Ambulatory Visit (HOSPITAL_COMMUNITY)
Admission: RE | Admit: 2021-04-16 | Discharge: 2021-04-16 | Disposition: A | Discharge status: Home / Self Care | Payer: No Typology Code available for payment source | Source: Ambulatory Visit | Attending: Gastroenterology | Admitting: Gastroenterology

## 2021-04-16 DIAGNOSIS — K3189 Other diseases of stomach and duodenum: Secondary | ICD-10-CM | POA: Diagnosis present

## 2021-04-16 DIAGNOSIS — K766 Portal hypertension: Secondary | ICD-10-CM | POA: Insufficient documentation

## 2021-04-17 ENCOUNTER — Telehealth: Payer: Self-pay | Admitting: Gastroenterology

## 2021-04-17 ENCOUNTER — Other Ambulatory Visit: Payer: Self-pay

## 2021-04-17 MED ORDER — TALICIA 250-12.5-10 MG PO CPDR: 4.0000 | DELAYED_RELEASE_CAPSULE | ORAL | 0 refills | Status: DC

## 2021-04-17 NOTE — Telephone Encounter (Signed)
See alternate results note 04/17/21

## 2021-04-21 ENCOUNTER — Other Ambulatory Visit: Payer: Self-pay

## 2021-04-21 MED ORDER — TALICIA 250-12.5-10 MG PO CPDR: 4.0000 | DELAYED_RELEASE_CAPSULE | ORAL | 0 refills | Status: DC

## 2021-04-21 NOTE — Telephone Encounter (Signed)
Received fax from [facility]- pt has [employer] benefits and asked for the prescription to be sent to the [facility] in [place]. I have sent the prescription as requested.

## 2021-05-07 ENCOUNTER — Telehealth: Payer: Self-pay | Admitting: Gastroenterology

## 2021-05-07 MED ORDER — BISMUTH SUBSALICYLATE 262 MG PO TABS: 2.0000 | ORAL_TABLET | Freq: Four times a day (QID) | ORAL | 0 refills | Status: AC

## 2021-05-07 MED ORDER — AMOXICILLIN 500 MG PO TABS: 500.0000 mg | ORAL_TABLET | Freq: Four times a day (QID) | ORAL | 0 refills | Status: AC

## 2021-05-07 MED ORDER — METRONIDAZOLE 250 MG PO TABS: 250.0000 mg | ORAL_TABLET | Freq: Four times a day (QID) | ORAL | 0 refills | Status: AC

## 2021-05-07 NOTE — Telephone Encounter (Signed)
Pt called stating that [facility] never gave him instructions on what to do to get medication. Pls call him.

## 2021-05-07 NOTE — Telephone Encounter (Signed)
The pt as been advised that the individual components have been sent to the [facility] in [place].

## 2021-06-03 ENCOUNTER — Ambulatory Visit: Payer: No Typology Code available for payment source | Admitting: Gastroenterology

## 2021-06-05 ENCOUNTER — Ambulatory Visit: Payer: No Typology Code available for payment source | Admitting: Gastroenterology

## 2022-06-04 IMAGING — US US ABDOMEN COMPLETE
1 series · 14 of 25 positions shown · non-contrast
Comparison: None.

CLINICAL DATA: Portal hypertension.

EXAM:
ABDOMEN ULTRASOUND COMPLETE

[Series 1: us abdomen complete · 14 of 67 slices shown]
[im 1/67]
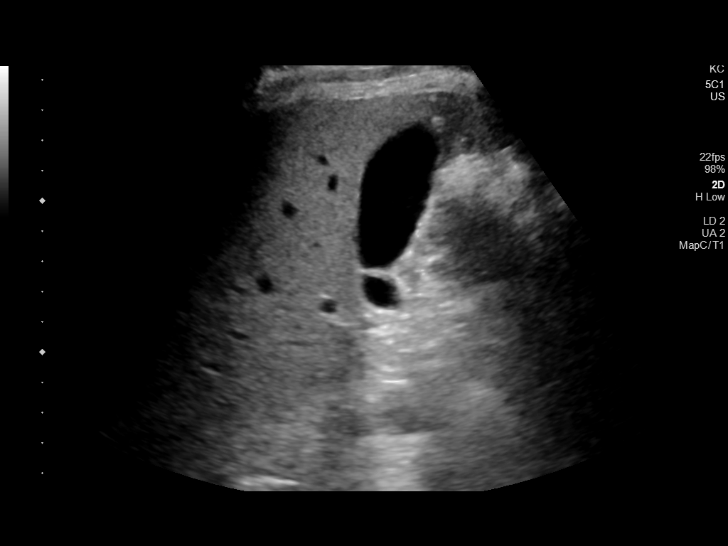
[im 6/67]
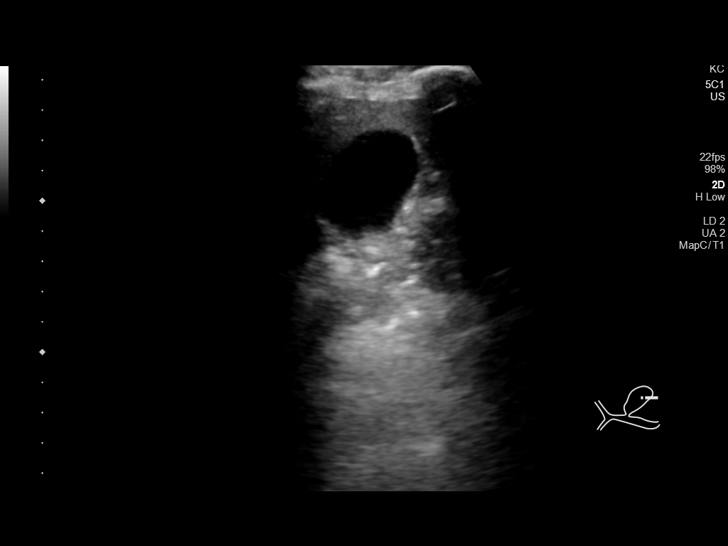
[im 12/67]
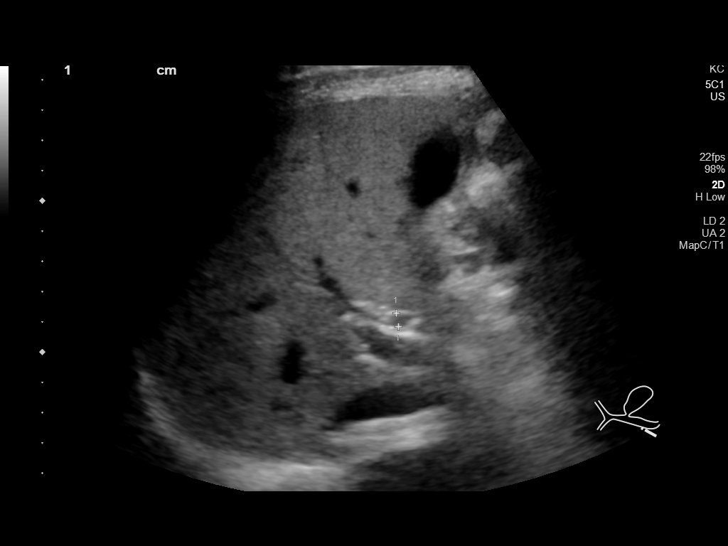
[im 17/67]
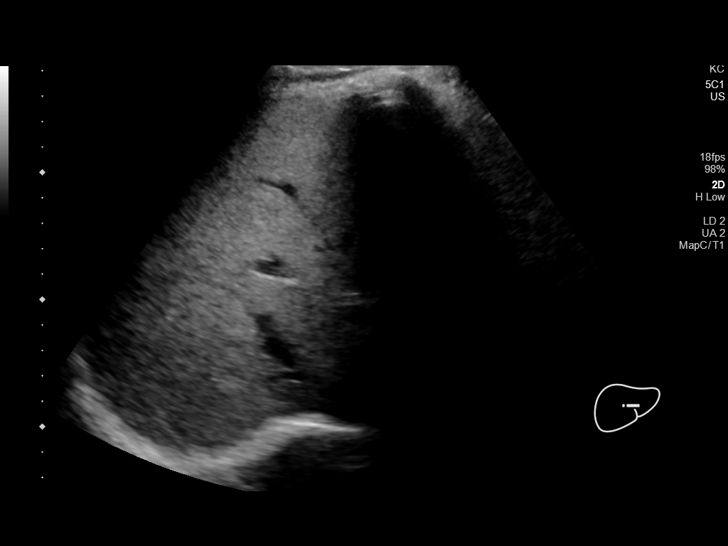
[im 23/67]
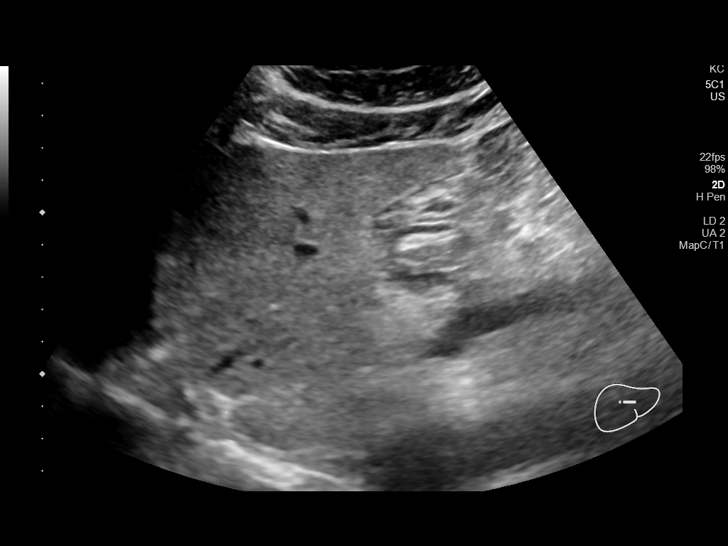
[im 25/67]
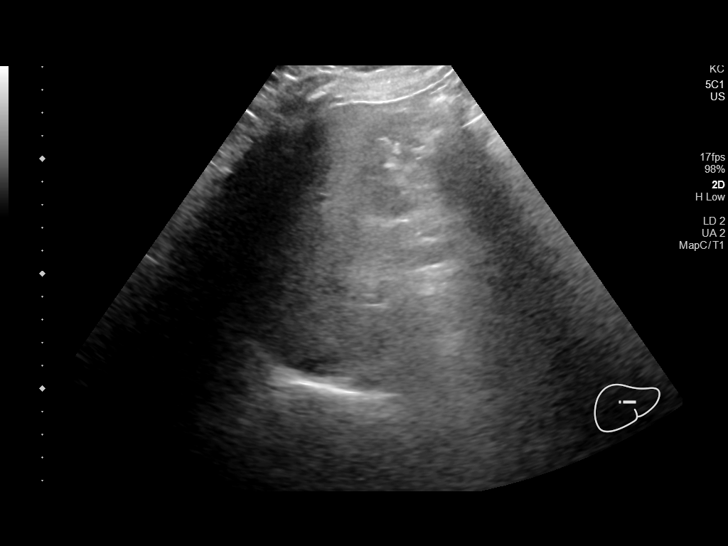
[im 31/67]
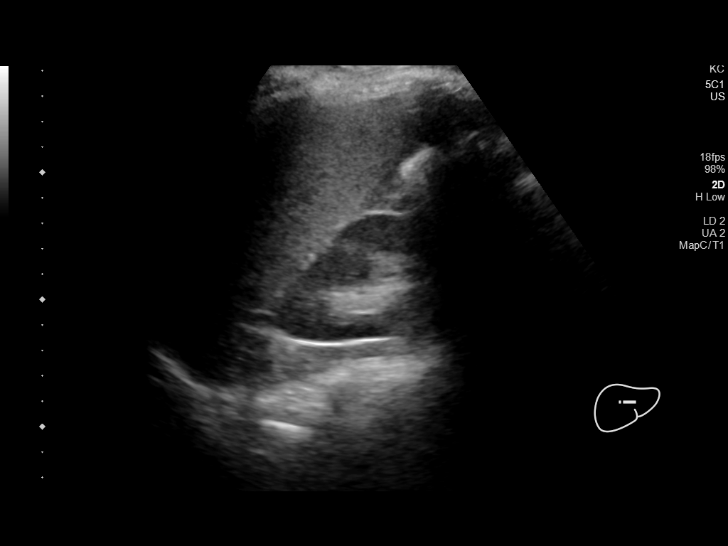
[im 36/67]
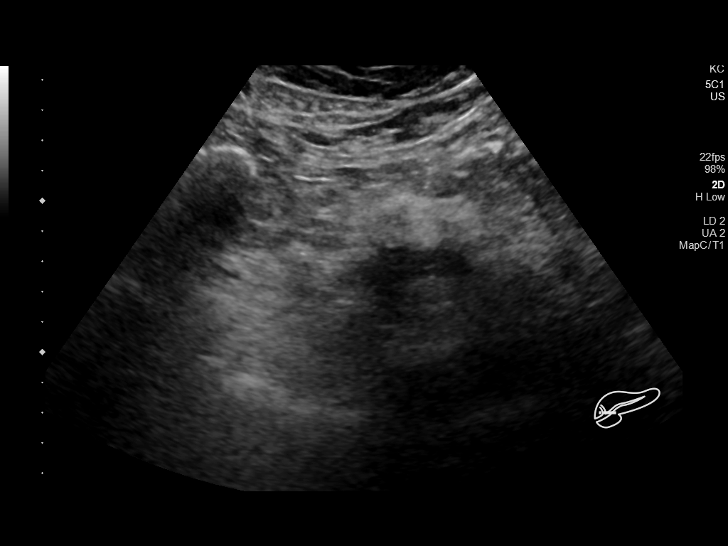
[im 42/67]
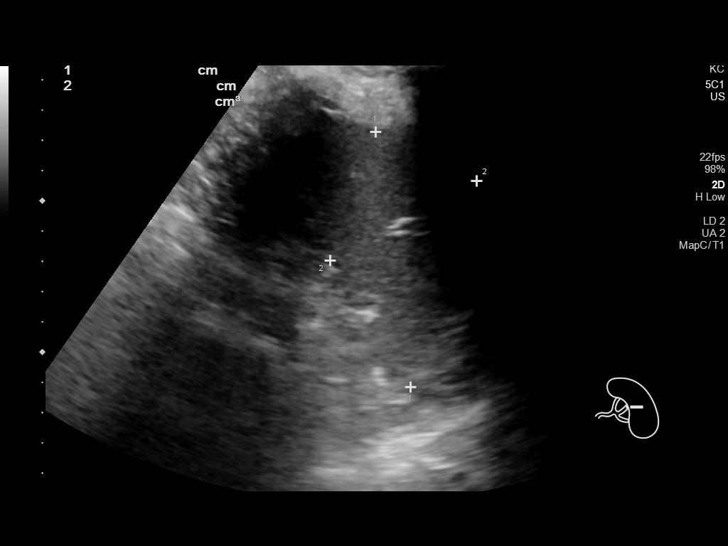
[im 45/67]
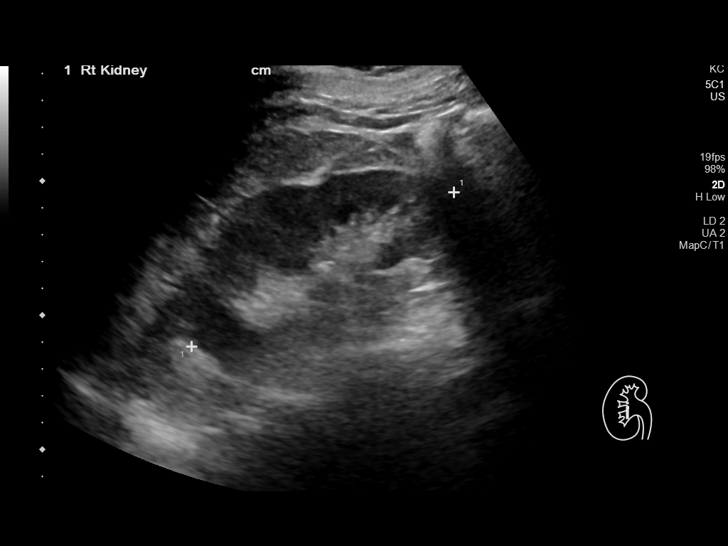
[im 50/67]
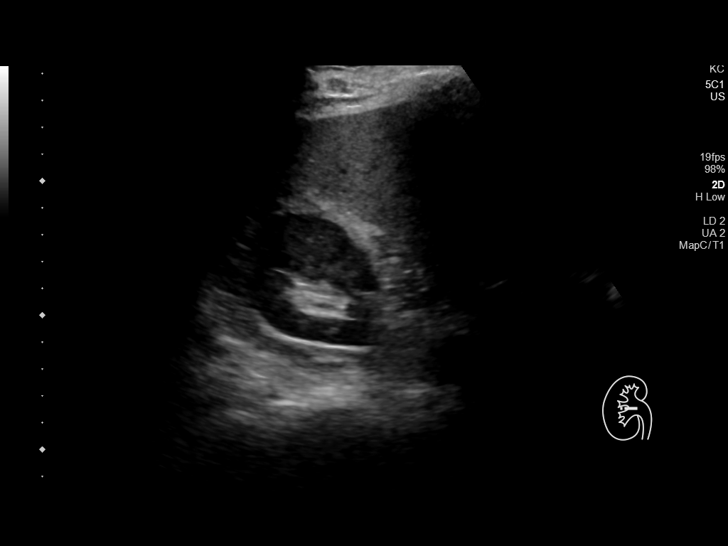
[im 56/67]
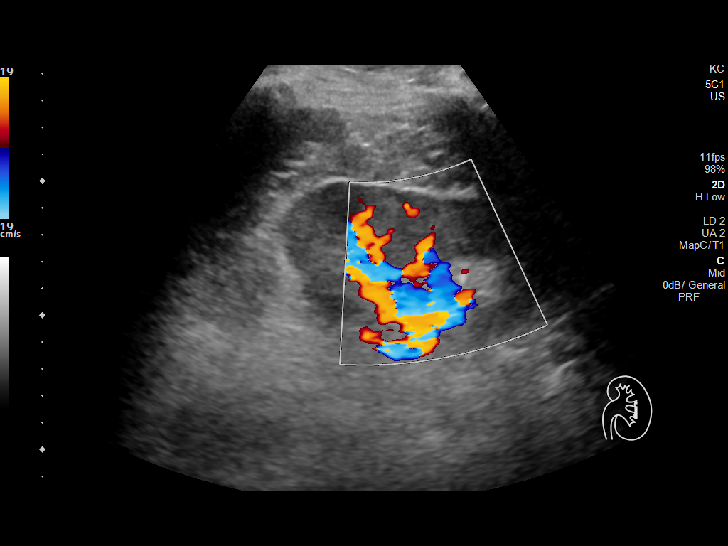
[im 61/67]
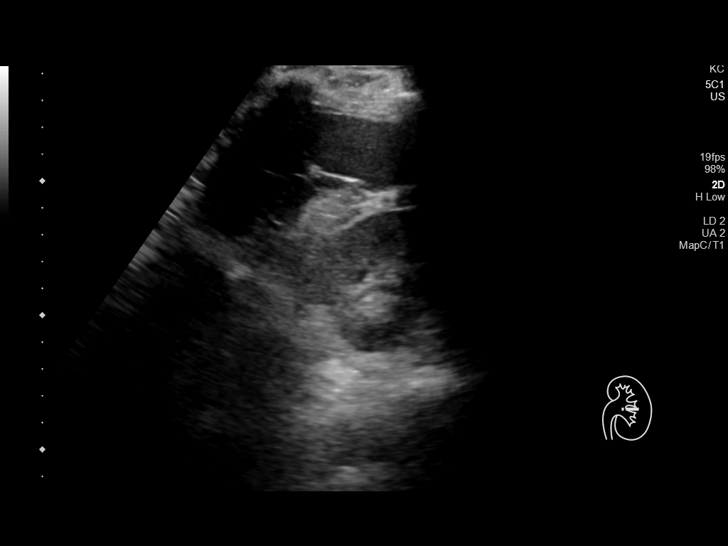
[im 67/67]
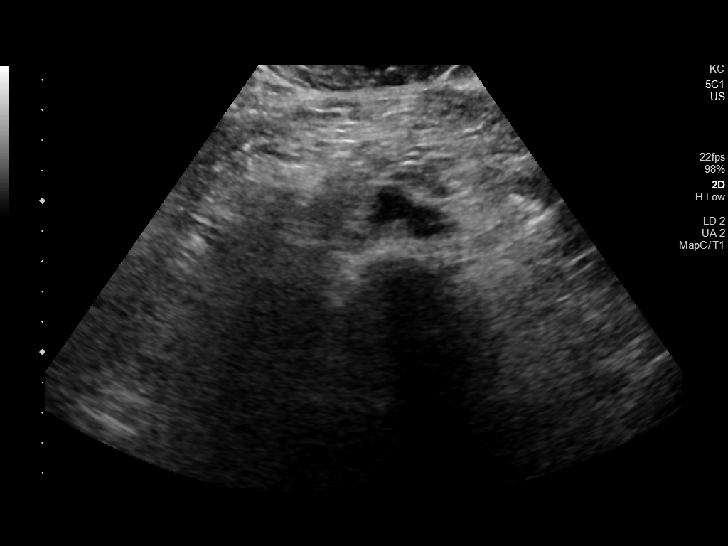

[14 of 25 positions shown; findings below may reference images not displayed]

FINDINGS: Gallbladder: No gallstones or wall thickening visualized. No
sonographic Murphy sign noted by sonographer.

Common bile duct: Diameter: 5 mm which is within normal limits.

Liver: No focal lesion identified. Increased echogenicity of hepatic
parenchyma is noted suggesting hepatic steatosis. Portal vein is
patent on color Doppler imaging with normal direction of blood flow
towards the liver.

IVC: No abnormality visualized.

Pancreas: Visualized portion unremarkable.

Spleen: Size and appearance within normal limits.

Right Kidney: Length: 11.3 cm. Echogenicity within normal limits. No
mass or hydronephrosis visualized.

Left Kidney: Length: 11.3 cm. Echogenicity within normal limits. No
mass or hydronephrosis visualized.

Abdominal aorta: No aneurysm visualized.

Other findings: None.
IMPRESSION: Increased echogenicity of hepatic parenchyma is noted most
consistent with hepatic steatosis.

No other definite abnormality seen in the abdomen.

## 2022-06-18 ENCOUNTER — Other Ambulatory Visit: Payer: Self-pay

## 2022-06-18 ENCOUNTER — Emergency Department (HOSPITAL_COMMUNITY)
Admission: EM | Admit: 2022-06-18 | Discharge: 2022-06-18 | Disposition: A | Discharge status: Home / Self Care | Payer: No Typology Code available for payment source | Attending: Emergency Medicine | Admitting: Emergency Medicine

## 2022-06-18 ENCOUNTER — Encounter (HOSPITAL_COMMUNITY): Payer: Self-pay | Admitting: Emergency Medicine

## 2022-06-18 DIAGNOSIS — Z79899 Other long term (current) drug therapy: Secondary | ICD-10-CM | POA: Insufficient documentation

## 2022-06-18 DIAGNOSIS — F10120 Alcohol abuse with intoxication, uncomplicated: Secondary | ICD-10-CM | POA: Diagnosis not present

## 2022-06-18 DIAGNOSIS — Y92481 Parking lot as the place of occurrence of the external cause: Secondary | ICD-10-CM | POA: Diagnosis not present

## 2022-06-18 DIAGNOSIS — I10 Essential (primary) hypertension: Secondary | ICD-10-CM | POA: Diagnosis not present

## 2022-06-18 DIAGNOSIS — F10920 Alcohol use, unspecified with intoxication, uncomplicated: Secondary | ICD-10-CM

## 2022-06-18 DIAGNOSIS — W19XXXA Unspecified fall, initial encounter: Secondary | ICD-10-CM | POA: Diagnosis not present

## 2022-06-18 DIAGNOSIS — F1010 Alcohol abuse, uncomplicated: Secondary | ICD-10-CM | POA: Diagnosis present

## 2022-06-18 LAB — BASIC METABOLIC PANEL
Anion gap: 10 (ref 5–15)
BUN: 19 mg/dL (ref 8–23)
CO2: 20 mmol/L — ABNORMAL LOW (ref 22–32)
Calcium: 9 mg/dL (ref 8.9–10.3)
Chloride: 108 mmol/L (ref 98–111)
Creatinine, Ser: 1.23 mg/dL (ref 0.61–1.24)
GFR, Estimated: >60 mL/min (ref >60)
Glucose, Bld: 119 mg/dL — ABNORMAL HIGH (ref 70–99)
Potassium: 4 mmol/L (ref 3.5–5.1)
Sodium: 138 mmol/L (ref 135–145)

## 2022-06-18 LAB — CBC
HCT: 41.8 % (ref 39.0–52.0)
Hemoglobin: 13.9 g/dL (ref 13.0–17.0)
MCH: 29.6 pg (ref 26.0–34.0)
MCHC: 33.3 g/dL (ref 30.0–36.0)
MCV: 88.9 fL (ref 80.0–100.0)
Platelets: 204 10*3/uL (ref 150–400)
RBC: 4.7 MIL/uL (ref 4.22–5.81)
RDW: 15 % (ref 11.5–15.5)
WBC: 4.3 10*3/uL (ref 4.0–10.5)
nRBC: 0 % (ref 0.0–0.2)

## 2022-06-18 NOTE — ED Triage Notes (Signed)
EMS called out today by police due to a fall in the parking lot. ETOH on board. Patient is unable tell you the date and seemed confused to EMS. They also noted high blood pressure. Patient has been prescribed BP medications.    EMS vitals: 240/120 BP 100 HR 122 CBG

## 2022-06-18 NOTE — ED Provider Notes (Signed)
[facility] DEPT Provider Note   CSN: [id number] Arrival date & time: 06/18/22  1259     History  Chief Complaint  Patient presents with   Fall   Hypertension    Arthur Craig is a 61 y.o. male.  HPI 61 year old male history of alcohol abuse presents today via EMS after a fall in parking lot.  Patient states that he has been drinking alcohol and he drinks daily.  He states he intends to continue drinking.  He states that he passed out by a police car today.  He feels that this is secondary to alcohol use.  He states he did not wish to be transported by EMS transported him regardless.     Home Medications Prior to Admission medications   Medication Sig Start Date End Date Taking? Authorizing Provider  amLODipine (NORVASC) 10 MG tablet Take 10 mg by mouth daily.    [provider]  Cholecalciferol 50 MCG (2000 UT) TABS Take 1 tablet by mouth 2 (two) times daily. 11/28/20   [provider]  cloNIDine (CATAPRES) 0.1 MG tablet Take 0.1 mg by mouth daily. 01/08/21   [provider]  folic acid (FOLVITE) 1 MG tablet Take 1 mg by mouth daily.    [provider]  hydrochlorothiazide (MICROZIDE) 12.5 MG capsule Take 12.5 mg by mouth daily.    [provider]  lisinopril (PRINIVIL,ZESTRIL) 20 MG tablet Take 20 mg by mouth daily.    [provider]  naltrexone (DEPADE) 50 MG tablet Take 50 mg by mouth daily. 01/07/21   [provider]  omeprazole (PRILOSEC) 40 MG capsule Take 1 capsule (40 mg total) by mouth daily. 04/10/21   Mansouraty, Telford Nab., MD  sertraline (ZOLOFT) 100 MG tablet Take 100 mg by mouth as needed. 01/07/21   [provider]  traZODone (DESYREL) 50 MG tablet Take 50 mg by mouth at bedtime as needed. 01/07/21   [provider]  vitamin B-12 (CYANOCOBALAMIN) 100 MCG tablet Take 50 mcg by mouth daily.    [provider]      Allergies    Patient has no known  allergies.    Review of Systems   Review of Systems  Physical Exam Updated Vital Signs BP (!) 156/122 (BP Location: Left Arm)   Pulse (!) 110   Temp (!) 97.4 F (36.3 C) (Oral)   Resp 20   SpO2 100%  Physical Exam Vitals reviewed.  Constitutional:      General: He is not in acute distress.    Appearance: Normal appearance.  HENT:     Head: Normocephalic and atraumatic.     Right Ear: External ear normal.     Left Ear: External ear normal.     Nose: Nose normal.     Mouth/Throat:     Pharynx: Oropharynx is clear.  Eyes:     Extraocular Movements: Extraocular movements intact.     Pupils: Pupils are equal, round, and reactive to light.  Cardiovascular:     Rate and Rhythm: Normal rate and regular rhythm.     Pulses: Normal pulses.  Pulmonary:     Effort: Pulmonary effort is normal.  Abdominal:     Palpations: Abdomen is soft.  Musculoskeletal:        General: Normal range of motion.     Cervical back: Normal range of motion.  Skin:    General: Skin is warm.     Capillary Refill: Capillary refill takes less than  2 seconds.  Neurological:     General: No focal deficit present.     Mental Status: He is alert.     Comments: Patient is awake and alert and oriented.  He has some slurring of speech.  He is ambulatory without difficulty.  Psychiatric:        Mood and Affect: Mood normal.     ED Results / Procedures / Treatments   Labs (all labs ordered are listed, but only abnormal results are displayed) Labs Reviewed  BASIC METABOLIC PANEL - Abnormal; Notable for the following components:      Result Value   CO2 20 (*)    Glucose, Bld 119 (*)    All other components within normal limits  CBC    EKG None  Radiology No results found.  Procedures Procedures    Medications Ordered in ED Medications - No data to display  ED Course/ Medical Decision Making/ A&P                           Medical Decision Making 61 year old male who self-reports alcohol  abuse.  He does appear somewhat intoxicated but also appears to have capacity. The patient communicates his understanding of the medical situation. He voices understanding of the medical problem. He voices reasoning of his decision.  His reasoning appears to align with previous values. There is is not a surrogate available.    Patient's chart is reviewed.  He is advised that he needs further evaluation. However, if patient wishes to leave he does not appear to have a acutely compensated capacity and will be stable to make his own decisions Patient advised to stay in the ED until more sober.  However he appears to have capacity to leave if you insist on leaving  AGAINST MEDICAL ADVICE  Amount and/or Complexity of Data Reviewed Labs: ordered. Decision-making details documented in ED Course.           Final Clinical Impression(s) / ED Diagnoses Final diagnoses:  Alcoholic intoxication without complication [facility])    Rx / DC Orders ED Discharge Orders     None         Pattricia Boss, MD 06/18/22 [phone]

## 2022-10-07 DIAGNOSIS — C61 Malignant neoplasm of prostate: Secondary | ICD-10-CM

## 2022-10-07 HISTORY — DX: Malignant neoplasm of prostate: C61

## 2022-10-20 HISTORY — PX: SATURATION BIOPSY OF PROSTATE: SHX2375

## 2022-11-26 ENCOUNTER — Inpatient Hospital Stay
Admission: RE | Admit: 2022-11-26 | Discharge: 2022-11-26 | Disposition: A | Discharge status: Home / Self Care | Payer: Self-pay | Source: Ambulatory Visit | Attending: Radiation Oncology | Admitting: Radiation Oncology

## 2022-11-26 ENCOUNTER — Other Ambulatory Visit: Payer: Self-pay | Admitting: Radiation Oncology

## 2022-11-26 DIAGNOSIS — C61 Malignant neoplasm of prostate: Secondary | ICD-10-CM

## 2022-12-01 NOTE — Progress Notes (Signed)
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  IPSS:  29 SHIM:  14  Bowel/Bladder complaints, if any: No  Nausea/Vomiting, if any:  No  Pain issues, if any:  4/10 left shoulder has muscle relaxers  SAFETY ISSUES: Prior radiation? No Pacemaker/ICD? No Possible current pregnancy? Male Is the patient on methotrexate? No  Current Complaints / other details:  History of left knee and back pain uses brace knee.

## 2022-12-07 ENCOUNTER — Ambulatory Visit
Admission: RE | Admit: 2022-12-07 | Discharge: 2022-12-07 | Disposition: A | Discharge status: Home / Self Care | Payer: No Typology Code available for payment source | Source: Ambulatory Visit | Attending: Radiation Oncology | Admitting: Radiation Oncology

## 2022-12-07 ENCOUNTER — Encounter: Payer: Self-pay | Admitting: Radiation Oncology

## 2022-12-07 ENCOUNTER — Telehealth: Payer: Self-pay | Admitting: *Deleted

## 2022-12-07 VITALS — BP 150/82 | HR 91 | Temp 97.7°F | Resp 20 | Ht 70.0 in | Wt 183.8 lb

## 2022-12-07 DIAGNOSIS — F419 Anxiety disorder, unspecified: Secondary | ICD-10-CM | POA: Insufficient documentation

## 2022-12-07 DIAGNOSIS — Z79899 Other long term (current) drug therapy: Secondary | ICD-10-CM | POA: Diagnosis not present

## 2022-12-07 DIAGNOSIS — C61 Malignant neoplasm of prostate: Secondary | ICD-10-CM | POA: Insufficient documentation

## 2022-12-07 DIAGNOSIS — F17210 Nicotine dependence, cigarettes, uncomplicated: Secondary | ICD-10-CM | POA: Insufficient documentation

## 2022-12-07 DIAGNOSIS — I10 Essential (primary) hypertension: Secondary | ICD-10-CM | POA: Diagnosis not present

## 2022-12-07 DIAGNOSIS — Z8711 Personal history of peptic ulcer disease: Secondary | ICD-10-CM | POA: Diagnosis not present

## 2022-12-07 DIAGNOSIS — Z803 Family history of malignant neoplasm of breast: Secondary | ICD-10-CM | POA: Insufficient documentation

## 2022-12-07 NOTE — Telephone Encounter (Signed)
RETURNED PATIENT'S PHONE CALL, SPOKE WITH PATIENT. ?

## 2022-12-07 NOTE — Progress Notes (Signed)
Introduced myself to the patient as the prostate nurse navigator.  He is here to discuss his radiation treatment options.  Patient could benefit from transportation services, and informed patient once treatment decision is final we could assist in getting this coordinated. I gave him my business card and asked him to call me with questions or concerns.  Verbalized understanding.

## 2022-12-07 NOTE — Progress Notes (Signed)
Radiation Oncology         [phone] ________________________________  Initial Outpatient Consultation  Name: Arthur Craig MRN: [id number]  Date: 12/07/2022  DOB: [birth year]  [id number], [facility]  Alphia Kava, MD   REFERRING PHYSICIAN: Alphia Kava, MD  DIAGNOSIS: 62 y.o. gentleman with Stage T1c adenocarcinoma of the prostate with Gleason score of 3+4, and PSA of 8.6.    ICD-10-CM   1. Malignant neoplasm of prostate  C61       HISTORY OF PRESENT ILLNESS: Arthur Craig is a 62 y.o. male with a diagnosis of prostate cancer. He was noted to have an elevated PSA of 8.6 by his primary care physician with the [facility]. This was up from 3.49 in 01/2021. Accordingly, he was referred for evaluation in urology by Hanley Hays, NP on 08/16/22. This prompted prostate MRI on 09/24/22 showing: 8 mm focus in left posterior lateral peripheral zone at mid gland (PI-RADS 4); 5 mm focus in left anterior lateral peripheral zone at mid gland (PI-RADS 4); no evidence of lymphadenopathy or pelvic skeletal metastasis. The patient proceeded to transrectal ultrasound with 12 biopsies of the prostate on 10/20/22 by Dr. Domenica Fail, digital rectal examination performed at that time showed no nodules.  The prostate volume measured 36 cc.  Out of 19 core biopsies, 2 were positive.  The maximum Gleason score was 3+4, and this was seen in both samples from the left peripheral ROI (with perineural invasion).  Of note, he was also evaluated for gross hematuria with CT A/P on 09/24/22 showing: no stones or hydroureteronephrosis; questionable filling defect along right proximal/mid ureter; possible nodular thickening along anterior bladder wall; no adenopathy or aggressive osseous lesions. A cystoscopy was performed at the time of prostate biopsy, and results were negative.  The patient reviewed the biopsy results with his urologist and he has kindly been referred today for discussion of potential radiation treatment  options.   PREVIOUS RADIATION THERAPY: No  PAST MEDICAL HISTORY:  Past Medical History:  Diagnosis Date   Alcoholism    Anemia    Anxiety    Blood transfusion without reported diagnosis    Hypertension    Peptic ulcer       PAST SURGICAL HISTORY: Past Surgical History:  Procedure Laterality Date   FINGER SURGERY Right    index   removal of polyp from vocal cord     SKIN BIOPSY     mole removed from chin   TONSILLECTOMY      FAMILY HISTORY:  Family History  Problem Relation Age of Onset   Diabetes Mother    Pancreatic cancer Father    Diabetes Father    Breast cancer Sister    Diabetes Sister    Hypertension Sister    Esophageal cancer Maternal Uncle    Diabetes Other        aunts and uncles   Hypertension Other        aunts and uncles   Stroke Other        aunts and uncles   Hyperlipidemia Other        aunts and uncles   Colon cancer Neg Hx    Rectal cancer Neg Hx    Stomach cancer Neg Hx     SOCIAL HISTORY:  Social History   Socioeconomic History   Marital status: Single    Spouse name: Not on file   Number of children: 2   Years of education: Not on file   Highest education level:  Not on file  Occupational History   Not on file  Tobacco Use   Smoking status: Every Day    Packs/day: .5    Types: Cigarettes   Smokeless tobacco: Never  Vaping Use   Vaping Use: Never used  Substance and Sexual Activity   Alcohol use: Yes    Alcohol/week: 1.0 standard drink of alcohol    Types: 1 Cans of beer per week    Comment: stopped drinking 03/17/21   Drug use: No   Sexual activity: Not Currently  Other Topics Concern   Not on file  Social History Narrative   Not on file   Social Determinants of Health   Financial Resource Strain: Not on file  Food Insecurity: No Food Insecurity (12/07/2022)   Hunger Vital Sign    Worried About Running Out of Food in the Last Year: Never true    Ran Out of Food in the Last Year: Never true  Transportation Needs:  No Transportation Needs (12/07/2022)   PRAPARE - Hydrologist (Medical): No    Lack of Transportation (Non-Medical): No  Physical Activity: Not on file  Stress: Not on file  Social Connections: Not on file  Intimate Partner Violence: Not At Risk (12/07/2022)   Humiliation, Afraid, Rape, and Kick questionnaire    Fear of Current or Ex-Partner: No    Emotionally Abused: No    Physically Abused: No    Sexually Abused: No    ALLERGIES: Patient has no known allergies.  MEDICATIONS:  Current Outpatient Medications  Medication Sig Dispense Refill   lisinopril (ZESTRIL) 40 MG tablet TAKE ONE TABLET BY MOUTH DAILY FOR BLOOD PRESSURE     naloxone (NARCAN) nasal spray 4 mg/0.1 mL SPRAY 1 SPRAY INTO ONE NOSTRIL AS DIRECTED FOR OPIOID OVERDOSE - CALL 911 IMMEDIATELY, ADMINISTER DOSE, THEN TURN PERSON ON SIDE - IF NO RESPONSE IN 2-3 MINUTES OR PERSON RESPONDS BUT RELAPSES, REPEAT USING A NEW SPRAY DEVICE AND SPRAY INTO THE OTHER NOSTRIL     omeprazole (PRILOSEC) 20 MG capsule TAKE ONE CAPSULE BY MOUTH DAILY FOR ACID REFLUX (TAKE ON AN EMPTY STOMACH 30 MINUTES PRIOR TO A MEAL)     amLODipine (NORVASC) 10 MG tablet Take 10 mg by mouth daily.     Cholecalciferol 50 MCG (2000 UT) TABS Take 1 tablet by mouth 2 (two) times daily.     cloNIDine (CATAPRES) 0.1 MG tablet Take 0.1 mg by mouth daily.     folic acid (FOLVITE) 1 MG tablet Take 1 mg by mouth daily. (Patient not taking: Reported on 12/07/2022)     hydrochlorothiazide (HYDRODIURIL) 25 MG tablet TAKE ONE TABLET BY MOUTH DAILY FOR BLOOD PRESSURE (Patient not taking: Reported on 12/07/2022)     methocarbamol (ROBAXIN) 750 MG tablet TAKE ONE TABLET BY MOUTH FOUR TIMES A DAY FOR MUSCLE PAIN/SPASMS (MAY CAUSE DROWSINESS)     naltrexone (DEPADE) 50 MG tablet Take 50 mg by mouth daily.     sertraline (ZOLOFT) 100 MG tablet Take 100 mg by mouth as needed.     traZODone (DESYREL) 50 MG tablet Take 50 mg by mouth at bedtime as needed.      vitamin B-12 (CYANOCOBALAMIN) 100 MCG tablet Take 50 mcg by mouth daily.     No current facility-administered medications for this encounter.    REVIEW OF SYSTEMS:  On review of systems, the patient reports that he is doing well overall. He denies any chest pain, shortness of breath, cough, fevers,  chills, night sweats, unintended weight changes. He denies any bowel disturbances, and denies abdominal pain, nausea or vomiting. He denies any new musculoskeletal or joint aches or pains. His IPSS was 29, indicating severe urinary symptoms. His SHIM was 14, indicating he has moderate erectile dysfunction. A complete review of systems is obtained and is otherwise negative.    PHYSICAL EXAM:  Wt Readings from Last 3 Encounters:  12/07/22 183 lb 12.8 oz (83.4 kg)  04/10/21 193 lb (87.5 kg)  01/14/21 193 lb 2 oz (87.6 kg)   Temp Readings from Last 3 Encounters:  12/07/22 97.7 F (36.5 C)  06/18/22 (!) 97.4 F (36.3 C) (Oral)  04/10/21 (!) 96 F (35.6 C)   BP Readings from Last 3 Encounters:  12/07/22 (!) 150/82  06/18/22 (!) 156/122  04/10/21 116/69   Pulse Readings from Last 3 Encounters:  12/07/22 91  06/18/22 (!) 110  04/10/21 62   Pain Assessment Pain Score: 4  Pain Loc: Shoulder (left)/10  In general this is a well appearing  male in no acute distress. He's alert and oriented x4 and appropriate throughout the examination. Cardiopulmonary assessment is negative for acute distress, and he exhibits normal effort.     KPS = 80  100 - Normal; no complaints; no evidence of disease. 90   - Able to carry on normal activity; minor signs or symptoms of disease. 80   - Normal activity with effort; some signs or symptoms of disease. 65   - Cares for self; unable to carry on normal activity or to do active work. 60   - Requires occasional assistance, but is able to care for most of his personal needs. 50   - Requires considerable assistance and frequent medical care. 63   - Disabled;  requires special care and assistance. 80   - Severely disabled; hospital admission is indicated although death not imminent. 84   - Very sick; hospital admission necessary; active supportive treatment necessary. 10   - Moribund; fatal processes progressing rapidly. 0     - Dead  Karnofsky DA, [place], Craver LS and Burchenal [facility] [phone]) The use of the nitrogen mustards in the palliative treatment of carcinoma: with particular reference to bronchogenic carcinoma Cancer 1 634-56  LABORATORY DATA:  Lab Results  Component Value Date   WBC 4.3 06/18/2022   HGB 13.9 06/18/2022   HCT 41.8 06/18/2022   MCV 88.9 06/18/2022   PLT 204 06/18/2022   Lab Results  Component Value Date   NA 138 06/18/2022   K 4.0 06/18/2022   CL 108 06/18/2022   CO2 20 (L) 06/18/2022   Lab Results  Component Value Date   ALT 43 (A) 10/31/2020   AST 30 10/31/2020     RADIOGRAPHY: No results found.    IMPRESSION/PLAN: 1. 62 y.o. gentleman with Stage T1c adenocarcinoma of the prostate with Gleason Score of 3+4, and PSA of 8.6. We discussed the patient's workup and outlined the nature of prostate cancer in this setting. The patient's T stage, Gleason's score, and PSA put him into the favorable intermediate risk group. Accordingly, he is eligible for a variety of potential treatment options including brachytherapy, 5.5 weeks of external radiation, or prostatectomy. His IPSS indicates that he is experiencing severe urinary symptoms, indicating he may not be a great candidate for brachytherapy. We discussed the available radiation techniques, and focused on the details and logistics of delivery. We discussed and outlined the risks, benefits, short and long-term effects associated with radiotherapy  and compared and contrasted these with prostatectomy. We discussed the role of SpaceOAR gel in reducing the rectal toxicity associated with radiotherapy. He appears to have a good understanding of his disease and our treatment  recommendations which are of curative intent.  He was encouraged to ask questions that were answered to his stated satisfaction.  At the conclusion of our conversation, the patient is interested in moving forward with external beam radiation therapy. He is currently receiving PT for his shoulder and needs to get stiches removed from his mouth. He would like to have this performed prior to initiating radiation therapy. Our navigator, Kathlee Nations, will reach out to the patient to ensure he does get scheduled for radiotherapy in the next 1-2 weeks.   We personally spent 60 minutes in this encounter including chart review, reviewing radiological studies, meeting face-to-face with the patient, entering orders and completing documentation.     Leona Singleton, PA-C    Tyler Pita, MD  [facility] Direct Dial: [phone]  Fax: [phone] [email or web address]  Skype  [email or web address]   This document serves as a record of services personally performed by Tyler Pita, MD and Leona Singleton, PA-C. It was created on their behalf by Wilburn Mylar, a trained medical scribe. The creation of this record is based on the scribe's personal observations and the provider's statements to them. This document has been checked and approved by the attending provider.

## 2022-12-09 NOTE — Progress Notes (Signed)
Patient was Rad Onc Consult on 4/2 for his stage T1c adenocarcinoma of the prostate with Gleason score of 3+4, and PSA of 8.6.   Patient has confirmed he would like to move forward with 5.5 weeks of daily radiation.   MD's notified, awaiting CT Simulation to be scheduled.   Plan of care in progress.

## 2022-12-10 ENCOUNTER — Telehealth: Payer: Self-pay | Admitting: *Deleted

## 2022-12-10 NOTE — Telephone Encounter (Signed)
CALLED PATIENT TO INFORM OF SIM APPT. FOR 12-17-22- ARRIVAL TIME- 2:45 PM @ [facility], LVM FOR A RETURN CALL

## 2022-12-13 NOTE — Progress Notes (Signed)
RN spoke with patient regarding questions about treatment decision.   RN provided education regarding radiation, and patient is agreeable to move forward with surgical consult with Dr. Marlou Porch to ensure he is educated on all options for treatment.    RN will follow to ensure he gets scheduled for consult and follow up after to review final decision.

## 2022-12-17 ENCOUNTER — Ambulatory Visit: Payer: No Typology Code available for payment source | Admitting: Radiation Oncology

## 2022-12-20 NOTE — Progress Notes (Signed)
Pt is scheduled for surgical consult with Dr. Marlou Porch on 4/24.   RN will follow up with patient after consult to ensure treatment decision is finalized.

## 2022-12-30 NOTE — Progress Notes (Signed)
RN spoke with patient to follow up after recent consult with Dr. Marlou Porch from 4/24. Patient is now leaning towards brachytherapy.  Dr. Marlou Porch prescribed Tamsulosin 0.4mg  to see how his urinary symptoms improve due to severe urinary symptoms.    Patient will plan to start Tamsulosin 4/26, and RN will follow up to review urinary symptoms prior to finalizing treatment decision for brachytherapy.   MD notified.

## 2023-01-13 NOTE — Progress Notes (Signed)
RN spoke with patient to assess how he was doing with his urinary symptoms since starting Floxmax on 4/26.   Pt reports symptoms are about the same, however, he has not been taking medication on a daily basis. RN encouraged patient to be mindful of taking medication daily and he is agreeable for me to follow up next week to re-assess.

## 2023-01-24 NOTE — Progress Notes (Signed)
RN spoke with patient to follow up to assess how medication, Tamsulosin, was helping.  Patient reports still not taking as prescribed on a daily basis.  Patient was unable to discuss further at this time, but asked for me to call back tomorrow AM to further discuss.

## 2023-01-25 NOTE — Progress Notes (Signed)
Pt remains undecided regarding his treatment decisions at this time. RN provided education on options, side effects, and post treatment education.    Pt will like to take a few more days to decide and agreeable for RN to follow up.

## 2023-01-27 NOTE — Progress Notes (Signed)
RN spoke with patient answered questions related to treatment options.  Pt request that RN follow up on Tuesday.

## 2023-02-03 NOTE — Progress Notes (Signed)
RN left message for call back to discuss treatment decision.

## 2023-02-04 NOTE — Progress Notes (Signed)
Patient was a consult on 12/07/22 for his stage T1c adenocarcinoma of the prostate with Gleason Score of 3+4, and PSA of 8.6. Patient was undecided, but leaning heavily towards EBRT at that time.     Patient saw Dr. Marlou Porch for surgical eval on 4/24 and declined wanting prostatectomy.  Patient was prescribed Tamsulosin for his LUTS by Dr. Liliane Shi.   Patient is wanting to proceed with brachytherapy.  Patient is non-compliant with taking his Tamsulosin and I repeated an I-PSS today on phone with him, score of 15.  Prostate Volume 36   Patient believes a lot of his urinary symptoms stem from drinking a significant amount of beer, and voices his concerns that he will not be complaint with daily radiation, even with offers of certain times specific for him and transportation assistance.  I did give him a lot of education on side effects of brachytherapy, and with that knowledge he is agreeable to proceed.  RN notified MD's to ensure everyone is agreeable to move forward with brachytherapy.

## 2023-02-07 ENCOUNTER — Telehealth: Payer: Self-pay | Admitting: *Deleted

## 2023-02-07 NOTE — Telephone Encounter (Signed)
Called patient to ask questions, lvm for a return call 

## 2023-02-09 ENCOUNTER — Ambulatory Visit: Payer: Self-pay

## 2023-02-09 ENCOUNTER — Telehealth: Payer: Self-pay | Admitting: Gastroenterology

## 2023-02-09 NOTE — Telephone Encounter (Signed)
Returned pt call and made him aware that she should speak with a pharmacist and check the expiration date. He explains that he is an alcoholic and never started the medication because of that reason.  He tells me that he has decided today is the day he is trying to quit due to health concerns.  He has prostate cancer and also feels like he has symptoms from H pylori.  I tried to give him some encouragement and was going to tell him to follow up with our office but the line was dropped.  Tried to reach back but line is busy.

## 2023-02-09 NOTE — Telephone Encounter (Signed)
Inbound call from patient requesting a call back to discuss metronidazole and amoxicillin medication for H. Pylori. States he has had both medication since last year and has not started taking them. Would like to discuss if they are still okay to take. Please advise, thank you.

## 2023-02-09 NOTE — Telephone Encounter (Signed)
Message from Randol Kern sent at 02/09/2023  9:04 AM EDT  Summary: Medication questions   Pt wants to know if he can take expired medication, he has questions and wants to speak to a nurse  Best contact: [phone] or [phone]         Chief Complaint: wanting to take abx that was prescribed 1 year ago  Symptoms: vomiting blood, gagging, mentioned a white substance she noted in emesis, abd pain Frequency: yesterday but has been ongoing.  Pertinent Negatives: Patient denies back pain, diarrhea fever Disposition: [] ED /[] Urgent Care (no appt availability in office) / [] Appointment(In office/virtual)/ []  [place] [facility]/ [] Home Care/ [] Refused Recommended Disposition /[] [place] [facility]/ [x]  Follow-up with PCP Additional Notes: advised pt to call his GI doctor (offered to transfer but he stated he will call) Dr name is Dr Corliss Parish at [facility]. Gave pt number for him to call- advised that bleeding ulcer needs to be tx ASAP. Pt stated he never took the abx for dx H. Pylori and stated he drank alcohol. Pt stated he is an alcoholic and has not been taking care of himself. Advised to go to ED if he worsens. [facility] and advised not to take abx until he sees provider.   Reason for Disposition  [1] Vomiting AND [2] contains red blood or black ("coffee ground") material  (Exception: Few red streaks in vomit that only happened once.)  Answer Assessment - Initial Assessment Questions 1. NAME of MEDICINE: "What medicine(s) are you calling about?"     *No Answer* 2. QUESTION: "What is your question?" (e.g., double dose of medicine, side effect)     Can he take expired  3. PRESCRIBER: "Who prescribed the medicine?" Reason: if prescribed by specialist, call should be referred to that group.     *No Answer* 4. SYMPTOMS: "Do you have any symptoms?" If Yes, ask: "What symptoms are you having?"  "How bad are the symptoms (e.g., mild, moderate, severe)     *No  Answer* 5. PREGNANCY:  "Is there any chance that you are pregnant?" "When was your last menstrual period?"     N/a  Answer Assessment - Initial Assessment Questions 1. LOCATION: "Where does it hurt?"      Stomach  2. RADIATION: "Does the pain shoot anywhere else?" (e.g., chest, back)     *No Answer* 3. ONSET: "When did the pain begin?" (Minutes, hours or days ago)      For a year  4. SUDDEN: "Gradual or sudden onset?"     Gradual  5. PATTERN "Does the pain come and go, or is it constant?"    - If it comes and goes: "How long does it last?" "Do you have pain now?"     (Note: Comes and goes means the pain is intermittent. It goes away completely between bouts.)    - If constant: "Is it getting better, staying the same, or getting worse?"      (Note: Constant means the pain never goes away completely; most serious pain is constant and gets worse.)      *No Answer* 6. SEVERITY: "How bad is the pain?"  (e.g., Scale 1-10; mild, moderate, or severe)    - MILD (1-3): Doesn't interfere with normal activities, abdomen soft and not tender to touch.     - MODERATE (4-7): Interferes with normal activities or awakens from sleep, abdomen tender to touch.     - SEVERE (8-10): Excruciating pain, doubled over,  unable to do any normal activities.       moderate 7. RECURRENT SYMPTOM: "Have you ever had this type of stomach pain before?" If Yes, ask: "When was the last time?" and "What happened that time?"      Was dx with ulcer  8. CAUSE: "What do you think is causing the stomach pain?"     ulcer 9. RELIEVING/AGGRAVATING FACTORS: "What makes it better or worse?" (e.g., antacids, bending or twisting motion, bowel movement)     Drinking beer due to feeling overwhelmed, sleeping and self isolating 10. OTHER SYMPTOMS: "Do you have any other symptoms?" (e.g., back pain, diarrhea, fever, urination pain, vomiting)       Vomiting, vomiting blood red varies  Protocols used: Medication Question Call-A-AH,  Abdominal Pain - Male-A-AH

## 2023-02-24 ENCOUNTER — Telehealth: Payer: Self-pay

## 2023-03-04 ENCOUNTER — Telehealth: Payer: Self-pay | Admitting: *Deleted

## 2023-03-04 NOTE — Telephone Encounter (Signed)
Returned patient's phone call, lvm for a return call 

## 2023-03-09 ENCOUNTER — Encounter: Payer: Self-pay | Admitting: Urology

## 2023-03-09 NOTE — Progress Notes (Addendum)
Pre-seed nursing interview for a diagnosis of Malignant neoplasm of prostate (HCC).  Patient identity verified x2.   Patient reports doing well. No issues conveyed at this time.  Meaningful use complete.  Urinary Management medication(s)- None currently Urology appointment date- None currently, with Dr. Liliane Shi at [facility]  Ht 5\' 10"  (1.778 m)   Wt 182 lb (82.6 kg)   BMI 26.11 kg/m   This concludes the interaction.  Ruel Favors, LPN

## 2023-03-16 ENCOUNTER — Telehealth: Payer: Self-pay | Admitting: *Deleted

## 2023-03-16 NOTE — Telephone Encounter (Signed)
CALLED PATIENT TO REMIND OF APPTS. FOR 03-18-23, LVM FOR A RETURN CALL

## 2023-03-18 ENCOUNTER — Encounter: Payer: Self-pay | Admitting: Urology

## 2023-03-18 ENCOUNTER — Encounter: Payer: Self-pay | Admitting: Radiation Oncology

## 2023-03-18 ENCOUNTER — Ambulatory Visit
Admission: RE | Admit: 2023-03-18 | Discharge: 2023-03-18 | Disposition: A | Discharge status: Home / Self Care | Payer: No Typology Code available for payment source | Source: Ambulatory Visit | Attending: Radiation Oncology | Admitting: Radiation Oncology

## 2023-03-18 ENCOUNTER — Other Ambulatory Visit: Payer: Self-pay

## 2023-03-18 ENCOUNTER — Ambulatory Visit
Admission: RE | Admit: 2023-03-18 | Discharge: 2023-03-18 | Disposition: A | Discharge status: Home / Self Care | Payer: No Typology Code available for payment source | Source: Ambulatory Visit | Attending: Urology | Admitting: Urology

## 2023-03-18 ENCOUNTER — Encounter (HOSPITAL_COMMUNITY)
Admission: RE | Admit: 2023-03-18 | Discharge: 2023-03-18 | Disposition: A | Discharge status: Home / Self Care | Payer: No Typology Code available for payment source | Source: Ambulatory Visit | Attending: Urology | Admitting: Urology

## 2023-03-18 VITALS — Ht 70.0 in | Wt 193.0 lb

## 2023-03-18 VITALS — Wt 193.0 lb

## 2023-03-18 DIAGNOSIS — C61 Malignant neoplasm of prostate: Secondary | ICD-10-CM

## 2023-03-18 DIAGNOSIS — Z01810 Encounter for preprocedural cardiovascular examination: Secondary | ICD-10-CM | POA: Diagnosis present

## 2023-03-18 DIAGNOSIS — Z01818 Encounter for other preprocedural examination: Secondary | ICD-10-CM

## 2023-03-18 NOTE — Progress Notes (Signed)
  Radiation Oncology         [phone] ________________________________  Name: Arthur Craig MRN: [id number]  Date: 03/18/2023  DOB: 05-06-1961  SIMULATION AND TREATMENT PLANNING NOTE PUBIC ARCH STUDY  [id number], [facility]  Regino Bellow, MD  DIAGNOSIS: 62 y.o. gentleman with Stage T1c adenocarcinoma of the prostate with Gleason score of 3+4, and PSA of 8.6.   Oncology History  Malignant neoplasm of prostate (HCC)  10/20/2022 Cancer Staging   Staging form: Prostate, AJCC 8th Edition - Clinical stage from 10/20/2022: Stage IIB (cT1c, cN0, cM0, PSA: 8.6, Grade Group: 2) - Signed by Marcello Fennel, PA-C on 03/18/2023 Histopathologic type: Adenocarcinoma, NOS Stage prefix: Initial diagnosis Prostate specific antigen (PSA) range: Less than 10 Gleason primary pattern: 3 Gleason secondary pattern: 4 Gleason score: 7 Histologic grading system: 5 grade system Number of biopsy cores examined: 19 Number of biopsy cores positive: 2 Location of positive needle core biopsies: One side   12/07/2022 Initial Diagnosis   Malignant neoplasm of prostate (HCC)       ICD-10-CM   1. Malignant neoplasm of prostate (HCC)  C61       COMPLEX SIMULATION:  The patient presented today for evaluation for possible prostate seed implant. He was brought to the radiation planning suite and placed supine on the CT couch. A 3-dimensional image study set was obtained in upload to the planning computer. There, on each axial slice, I contoured the prostate gland. Then, using three-dimensional radiation planning tools I reconstructed the prostate in view of the structures from the transperineal needle pathway to assess for possible pubic arch interference. In doing so, I did not appreciate any pubic arch interference. Also, the patient's prostate volume was estimated based on the drawn structure. The volume was 31 cc.  Given the pubic arch appearance and prostate volume, patient remains a good candidate to proceed  with prostate seed implant. Today, he freely provided informed written consent to proceed.    PLAN: The patient will undergo prostate seed implant.   ________________________________  Artist Pais. Kathrynn Running, M.D.

## 2023-03-18 NOTE — Progress Notes (Signed)
Radiation Oncology         [phone]) [phone] ________________________________  Outpatient Follow up- Pre-seed visit  Name: Arthur Craig MRN: [id number]  Date: 03/18/2023  DOB: 1961-03-31  [facility]  Regino Bellow, MD   REFERRING PHYSICIAN: Regino Bellow, MD  DIAGNOSIS: 62 y.o. gentleman with Stage T1c adenocarcinoma of the prostate with Gleason score of 3+4, and PSA of 8.6.     ICD-10-CM   1. Malignant neoplasm of prostate (HCC)  C61       HISTORY OF PRESENT ILLNESS: Arthur Craig is a 62 y.o. male with a diagnosis of prostate cancer. He was noted to have an elevated PSA of 8.6 by his primary care physician with the [facility]. This was up from 3.49 in 01/2021. Accordingly, he was referred for evaluation in urology by Glenice Bow, NP on 08/16/22. This prompted prostate MRI on 09/24/22 showing: 8 mm focus in left posterior lateral peripheral zone at mid gland (PI-RADS 4); 5 mm focus in left anterior lateral peripheral zone at mid gland (PI-RADS 4); no evidence of lymphadenopathy or pelvic skeletal metastasis. The patient proceeded to transrectal ultrasound with 12 biopsies of the prostate on 10/20/22 by Dr. Garald Braver, digital rectal examination performed at that time showed no nodules.  The prostate volume measured 36 cc.  Out of 19 core biopsies, 2 were positive.  The maximum Gleason score was 3+4, and this was seen in both samples from the left peripheral ROI (with perineural invasion).   Of note, he was also evaluated for gross hematuria with CT A/P on 09/24/22 showing: no stones or hydroureteronephrosis; questionable filling defect along right proximal/mid ureter; possible nodular thickening along anterior bladder wall; no adenopathy or aggressive osseous lesions. A cystoscopy was performed at the time of prostate biopsy, and results were negative.  The patient reviewed the biopsy results with his urologist and was kindly referred to [place] for discussion of potential radiation treatment  options. We initially met the patient on 12/07/22 and he was most interested in proceeding with 5.5 weeks of daily external beam radiation but after further consideration, he has ultimately decided to proceed with brachytherapy and SpaceOAR gel placement for treatment of his disease. He is here today for his pre-procedure imaging for planning and to answer any additional questions he may have about this treatment.   PREVIOUS RADIATION THERAPY: No  PAST MEDICAL HISTORY:  Past Medical History:  Diagnosis Date   Alcoholism (HCC)    Anemia    Anxiety    Blood transfusion without reported diagnosis    Hypertension    Peptic ulcer       PAST SURGICAL HISTORY: Past Surgical History:  Procedure Laterality Date   FINGER SURGERY Right    index   removal of polyp from vocal cord     SKIN BIOPSY     mole removed from chin   TONSILLECTOMY      FAMILY HISTORY:  Family History  Problem Relation Age of Onset   Diabetes Mother    Pancreatic cancer Father    Diabetes Father    Breast cancer Sister    Diabetes Sister    Hypertension Sister    Esophageal cancer Maternal Uncle    Diabetes Other        aunts and uncles   Hypertension Other        aunts and uncles   Stroke Other        aunts and uncles   Hyperlipidemia Other  aunts and uncles   Colon cancer Neg Hx    Rectal cancer Neg Hx    Stomach cancer Neg Hx     SOCIAL HISTORY:  Social History   Socioeconomic History   Marital status: Single    Spouse name: Not on file   Number of children: 2   Years of education: Not on file   Highest education level: Not on file  Occupational History   Not on file  Tobacco Use   Smoking status: Every Day    Current packs/day: 0.50    Types: Cigarettes   Smokeless tobacco: Never  Vaping Use   Vaping status: Never Used  Substance and Sexual Activity   Alcohol use: Yes    Alcohol/week: 1.0 standard drink of alcohol    Types: 1 Cans of beer per week    Comment: stopped drinking  03/17/21   Drug use: No   Sexual activity: Not Currently  Other Topics Concern   Not on file  Social History Narrative   Not on file   Social Determinants of Health   Financial Resource Strain: Not on file  Food Insecurity: No Food Insecurity (12/07/2022)   Hunger Vital Sign    Worried About Running Out of Food in the Last Year: Never true    Ran Out of Food in the Last Year: Never true  Transportation Needs: No Transportation Needs (12/07/2022)   PRAPARE - Administrator, Civil Service (Medical): No    Lack of Transportation (Non-Medical): No  Physical Activity: Not on file  Stress: Not on file  Social Connections: Not on file  Intimate Partner Violence: Not At Risk (12/07/2022)   Humiliation, Afraid, Rape, and Kick questionnaire    Fear of Current or Ex-Partner: No    Emotionally Abused: No    Physically Abused: No    Sexually Abused: No    ALLERGIES: Patient has no known allergies.  MEDICATIONS:  Current Outpatient Medications  Medication Sig Dispense Refill   amLODipine (NORVASC) 10 MG tablet Take 10 mg by mouth daily.     Cholecalciferol 50 MCG (2000 UT) TABS Take 1 tablet by mouth 2 (two) times daily.     cloNIDine (CATAPRES) 0.1 MG tablet Take 0.1 mg by mouth daily.     folic acid (FOLVITE) 1 MG tablet Take 1 mg by mouth daily. (Patient not taking: Reported on 12/07/2022)     hydrochlorothiazide (HYDRODIURIL) 25 MG tablet TAKE ONE TABLET BY MOUTH DAILY FOR BLOOD PRESSURE (Patient not taking: Reported on 12/07/2022)     lisinopril (ZESTRIL) 40 MG tablet TAKE ONE TABLET BY MOUTH DAILY FOR BLOOD PRESSURE     methocarbamol (ROBAXIN) 750 MG tablet TAKE ONE TABLET BY MOUTH FOUR TIMES A DAY FOR MUSCLE PAIN/SPASMS (MAY CAUSE DROWSINESS)     naloxone (NARCAN) nasal spray 4 mg/0.1 mL SPRAY 1 SPRAY INTO ONE NOSTRIL AS DIRECTED FOR OPIOID OVERDOSE - CALL 911 IMMEDIATELY, ADMINISTER DOSE, THEN TURN PERSON ON SIDE - IF NO RESPONSE IN 2-3 MINUTES OR PERSON RESPONDS BUT RELAPSES,  REPEAT USING A NEW SPRAY DEVICE AND SPRAY INTO THE OTHER NOSTRIL     naltrexone (DEPADE) 50 MG tablet Take 50 mg by mouth daily.     omeprazole (PRILOSEC) 20 MG capsule TAKE ONE CAPSULE BY MOUTH DAILY FOR ACID REFLUX (TAKE ON AN EMPTY STOMACH 30 MINUTES PRIOR TO A MEAL)     sertraline (ZOLOFT) 100 MG tablet Take 100 mg by mouth as needed.     traZODone (DESYREL)  50 MG tablet Take 50 mg by mouth at bedtime as needed.     vitamin B-12 (CYANOCOBALAMIN) 100 MCG tablet Take 50 mcg by mouth daily.     No current facility-administered medications for this encounter.    REVIEW OF SYSTEMS:  On review of systems, the patient reports that he is doing well overall. He denies any chest pain, shortness of breath, cough, fevers, chills, night sweats, unintended weight changes. He denies any bowel disturbances, and denies abdominal pain, nausea or vomiting. He denies any new musculoskeletal or joint aches or pains. His IPSS was 29, indicating severe urinary symptoms. His SHIM was 14, indicating he has moderate erectile dysfunction. A complete review of systems is obtained and is otherwise negative.     PHYSICAL EXAM:  Wt Readings from Last 3 Encounters:  03/09/23 182 lb (82.6 kg)  12/07/22 183 lb 12.8 oz (83.4 kg)  04/10/21 193 lb (87.5 kg)   Temp Readings from Last 3 Encounters:  12/07/22 97.7 F (36.5 C)  06/18/22 (!) 97.4 F (36.3 C) (Oral)  04/10/21 (!) 96 F (35.6 C)   BP Readings from Last 3 Encounters:  12/07/22 (!) 150/82  06/18/22 (!) 156/122  04/10/21 116/69   Pulse Readings from Last 3 Encounters:  12/07/22 91  06/18/22 (!) 110  04/10/21 62   Pain Assessment Pain Score: 0-No pain/10  In general this is a well appearing African American man in no acute distress. He's alert and oriented x4 and appropriate throughout the examination. Cardiopulmonary assessment is negative for acute distress, and he exhibits normal effort.     KPS = 80  100 - Normal; no complaints; no evidence of  disease. 90   - Able to carry on normal activity; minor signs or symptoms of disease. 80   - Normal activity with effort; some signs or symptoms of disease. 21   - Cares for self; unable to carry on normal activity or to do active work. 60   - Requires occasional assistance, but is able to care for most of his personal needs. 50   - Requires considerable assistance and frequent medical care. 40   - Disabled; requires special care and assistance. 30   - Severely disabled; hospital admission is indicated although death not imminent. 20   - Very sick; hospital admission necessary; active supportive treatment necessary. 10   - Moribund; fatal processes progressing rapidly. 0     - Dead  Karnofsky DA, Abelmann WH, Craver LS and Burchenal [facility] [phone]) The use of the nitrogen mustards in the palliative treatment of carcinoma: with particular reference to bronchogenic carcinoma Cancer 1 [phone]  LABORATORY DATA:  Lab Results  Component Value Date   WBC 4.3 06/18/2022   HGB 13.9 06/18/2022   HCT 41.8 06/18/2022   MCV 88.9 06/18/2022   PLT 204 06/18/2022   Lab Results  Component Value Date   NA 138 06/18/2022   K 4.0 06/18/2022   CL 108 06/18/2022   CO2 20 (L) 06/18/2022   Lab Results  Component Value Date   ALT 43 (A) 10/31/2020   AST 30 10/31/2020     RADIOGRAPHY: No results found.    IMPRESSION/PLAN: 1. 62 y.o. gentleman with Stage T1c adenocarcinoma of the prostate with Gleason score of 3+4, and PSA of 8.6.  The patient has elected to proceed with seed implant for treatment of his disease. We reviewed the risks, benefits, short and long-term effects associated with brachytherapy and discussed the role of SpaceOAR in reducing  the rectal toxicity associated with radiotherapy.  He appears to have a good understanding of his disease and our treatment recommendations which are of curative intent.  He was encouraged to ask questions that were answered to his stated satisfaction. He has freely  signed written consent to proceed today in the office and a copy of this document will be placed in his medical record. His procedure is tentatively scheduled for 06/10/23 in collaboration with Dr. Marlou Porch and we will see him back for his post-procedure visit approximately 3 weeks thereafter. We look forward to continuing to participate in his care. He knows that he is welcome to call with any questions or concerns at any time in the interim.  I personally spent 30 minutes in this encounter including chart review, reviewing radiological studies, meeting face-to-face with the patient, entering orders and completing documentation.    Marguarite Arbour, MMS, PA-C [place]  [facility] Radiation Oncology Physician Assistant Direct Dial: [phone]  Fax: [phone]

## 2023-03-22 ENCOUNTER — Other Ambulatory Visit: Payer: Self-pay | Admitting: Urology

## 2023-06-01 ENCOUNTER — Encounter (HOSPITAL_BASED_OUTPATIENT_CLINIC_OR_DEPARTMENT_OTHER): Payer: Self-pay | Admitting: Urology

## 2023-06-02 NOTE — Progress Notes (Signed)
RN attempted to contact patient prior to upcoming brachytherapy.  No answer, will attempt later.

## 2023-06-03 ENCOUNTER — Encounter (HOSPITAL_COMMUNITY): Payer: Self-pay | Admitting: Anesthesiology

## 2023-06-03 ENCOUNTER — Encounter (HOSPITAL_BASED_OUTPATIENT_CLINIC_OR_DEPARTMENT_OTHER): Payer: Self-pay | Admitting: Urology

## 2023-06-03 ENCOUNTER — Other Ambulatory Visit: Payer: Self-pay

## 2023-06-03 NOTE — Progress Notes (Signed)
Spoke w/ via phone for pre-op interview---pt Lab needs dos---- urine drug screen, I stat        Lab results------EKG 03-18-2023 epic COVID test -----patient states asymptomatic no test needed Arrive at -------1030 06-10-2023 NPO after MN NO Solid Food.  Clear liquids from MN until---930 Med rec completed Medications to take morning of surgery -----amlodipine, clonidine, omeprazole Diabetic medication -----n/a Patient instructed no nail polish to be worn day of surgery Patient instructed to bring photo id and insurance card day of surgery Patient aware to have Driver (ride ) / caregiver   sister lana burke  for 24 hours after surgery -  Patient Special Instructions -----do not use crack cocaine Pre-Op special Instructions -----fleets enema am of surgery Patient verbalized understanding of instructions that were given at this phone interview. Patient denies chest pain, sob, fever, cough at the interview.   Spoke with dr germeroth mda  and reviewed crack cocaine last used few days ago per pt on 06-03-2023, urine drug screen ordered per dr germeroth mda for day of surgery.

## 2023-06-09 ENCOUNTER — Telehealth: Payer: Self-pay | Admitting: *Deleted

## 2023-06-09 ENCOUNTER — Emergency Department (HOSPITAL_COMMUNITY): Payer: No Typology Code available for payment source

## 2023-06-09 ENCOUNTER — Encounter (HOSPITAL_COMMUNITY): Payer: Self-pay

## 2023-06-09 ENCOUNTER — Inpatient Hospital Stay (HOSPITAL_COMMUNITY)
Admission: EM | Admit: 2023-06-09 | Discharge: 2023-06-11 | DRG: 439 | Disposition: A | Discharge status: Home / Self Care | Payer: No Typology Code available for payment source | Attending: Internal Medicine | Admitting: Internal Medicine

## 2023-06-09 DIAGNOSIS — K859 Acute pancreatitis without necrosis or infection, unspecified: Principal | ICD-10-CM | POA: Diagnosis present

## 2023-06-09 DIAGNOSIS — E878 Other disorders of electrolyte and fluid balance, not elsewhere classified: Secondary | ICD-10-CM | POA: Diagnosis present

## 2023-06-09 DIAGNOSIS — E785 Hyperlipidemia, unspecified: Secondary | ICD-10-CM | POA: Diagnosis present

## 2023-06-09 DIAGNOSIS — K852 Alcohol induced acute pancreatitis without necrosis or infection: Secondary | ICD-10-CM | POA: Diagnosis present

## 2023-06-09 DIAGNOSIS — I160 Hypertensive urgency: Secondary | ICD-10-CM | POA: Diagnosis present

## 2023-06-09 DIAGNOSIS — I10 Essential (primary) hypertension: Secondary | ICD-10-CM | POA: Diagnosis present

## 2023-06-09 DIAGNOSIS — K219 Gastro-esophageal reflux disease without esophagitis: Secondary | ICD-10-CM | POA: Diagnosis present

## 2023-06-09 DIAGNOSIS — C61 Malignant neoplasm of prostate: Secondary | ICD-10-CM | POA: Diagnosis present

## 2023-06-09 DIAGNOSIS — Z833 Family history of diabetes mellitus: Secondary | ICD-10-CM | POA: Diagnosis not present

## 2023-06-09 DIAGNOSIS — F17210 Nicotine dependence, cigarettes, uncomplicated: Secondary | ICD-10-CM | POA: Diagnosis present

## 2023-06-09 DIAGNOSIS — Z79899 Other long term (current) drug therapy: Secondary | ICD-10-CM | POA: Diagnosis not present

## 2023-06-09 DIAGNOSIS — Z8546 Personal history of malignant neoplasm of prostate: Secondary | ICD-10-CM | POA: Diagnosis not present

## 2023-06-09 DIAGNOSIS — Z823 Family history of stroke: Secondary | ICD-10-CM

## 2023-06-09 DIAGNOSIS — Z83438 Family history of other disorder of lipoprotein metabolism and other lipidemia: Secondary | ICD-10-CM

## 2023-06-09 DIAGNOSIS — K760 Fatty (change of) liver, not elsewhere classified: Secondary | ICD-10-CM | POA: Diagnosis present

## 2023-06-09 DIAGNOSIS — Z800 Family history of malignant neoplasm of digestive organs: Secondary | ICD-10-CM | POA: Diagnosis not present

## 2023-06-09 DIAGNOSIS — Z803 Family history of malignant neoplasm of breast: Secondary | ICD-10-CM | POA: Diagnosis not present

## 2023-06-09 DIAGNOSIS — F1020 Alcohol dependence, uncomplicated: Secondary | ICD-10-CM | POA: Diagnosis present

## 2023-06-09 DIAGNOSIS — Z8711 Personal history of peptic ulcer disease: Secondary | ICD-10-CM

## 2023-06-09 DIAGNOSIS — E871 Hypo-osmolality and hyponatremia: Secondary | ICD-10-CM | POA: Diagnosis present

## 2023-06-09 DIAGNOSIS — Z8719 Personal history of other diseases of the digestive system: Secondary | ICD-10-CM

## 2023-06-09 DIAGNOSIS — Z8249 Family history of ischemic heart disease and other diseases of the circulatory system: Secondary | ICD-10-CM | POA: Diagnosis not present

## 2023-06-09 HISTORY — DX: Personal history of other diseases of the digestive system: Z87.19

## 2023-06-09 LAB — URINALYSIS, ROUTINE W REFLEX MICROSCOPIC
Bacteria, UA: NONE SEEN
Bilirubin Urine: NEGATIVE
Glucose, UA: 150 mg/dL — AB
Ketones, ur: 20 mg/dL — AB
Leukocytes,Ua: NEGATIVE
Nitrite: NEGATIVE
Protein, ur: 100 mg/dL — AB
Specific Gravity, Urine: 1.019 (ref 1.005–1.030)
pH: 5 (ref 5.0–8.0)

## 2023-06-09 LAB — COMPREHENSIVE METABOLIC PANEL
ALT: 120 U/L — ABNORMAL HIGH (ref 0–44)
AST: 112 U/L — ABNORMAL HIGH (ref 15–41)
Albumin: 4.5 g/dL (ref 3.5–5.0)
Alkaline Phosphatase: 58 U/L (ref 38–126)
Anion gap: 17 — ABNORMAL HIGH (ref 5–15)
BUN: 8 mg/dL (ref 8–23)
CO2: 21 mmol/L — ABNORMAL LOW (ref 22–32)
Calcium: 9.6 mg/dL (ref 8.9–10.3)
Chloride: 94 mmol/L — ABNORMAL LOW (ref 98–111)
Creatinine, Ser: 1.07 mg/dL (ref 0.61–1.24)
GFR, Estimated: >60 mL/min (ref >60)
Glucose, Bld: 167 mg/dL — ABNORMAL HIGH (ref 70–99)
Potassium: 3.7 mmol/L (ref 3.5–5.1)
Sodium: 132 mmol/L — ABNORMAL LOW (ref 135–145)
Total Bilirubin: 0.8 mg/dL (ref 0.3–1.2)
Total Protein: 7.7 g/dL (ref 6.5–8.1)

## 2023-06-09 LAB — CBC
HCT: 45.2 % (ref 39.0–52.0)
Hemoglobin: 15.1 g/dL (ref 13.0–17.0)
MCH: 30.6 pg (ref 26.0–34.0)
MCHC: 33.4 g/dL (ref 30.0–36.0)
MCV: 91.5 fL (ref 80.0–100.0)
Platelets: 141 10*3/uL — ABNORMAL LOW (ref 150–400)
RBC: 4.94 MIL/uL (ref 4.22–5.81)
RDW: 14.5 % (ref 11.5–15.5)
WBC: 6.5 10*3/uL (ref 4.0–10.5)
nRBC: 0 % (ref 0.0–0.2)

## 2023-06-09 LAB — ETHANOL: Alcohol, Ethyl (B): 23 mg/dL — ABNORMAL HIGH (ref <10)

## 2023-06-09 LAB — LIPASE, BLOOD: Lipase: 9380 U/L — ABNORMAL HIGH (ref 11–51)

## 2023-06-09 MED ORDER — ONDANSETRON HCL 4 MG PO TABS: 4.0000 mg | ORAL_TABLET | Freq: Four times a day (QID) | ORAL | Status: DC | PRN

## 2023-06-09 MED ORDER — LACTATED RINGERS IV SOLN: INTRAVENOUS | Status: AC

## 2023-06-09 MED ORDER — MORPHINE SULFATE (PF) 4 MG/ML IV SOLN
4.0000 mg | Freq: Once | INTRAVENOUS | Status: AC
  Administered 2023-06-09: 4 mg via INTRAVENOUS
  Filled 2023-06-09: qty 1

## 2023-06-09 MED ORDER — IOHEXOL 300 MG/ML  SOLN
100.0000 mL | Freq: Once | INTRAMUSCULAR | Status: AC | PRN
  Administered 2023-06-09: 100 mL via INTRAVENOUS

## 2023-06-09 MED ORDER — ACETAMINOPHEN 650 MG RE SUPP: 650.0000 mg | Freq: Four times a day (QID) | RECTAL | Status: DC | PRN

## 2023-06-09 MED ORDER — ONDANSETRON HCL 4 MG/2ML IJ SOLN
4.0000 mg | Freq: Once | INTRAMUSCULAR | Status: AC
  Administered 2023-06-09: 4 mg via INTRAVENOUS
  Filled 2023-06-09: qty 2

## 2023-06-09 MED ORDER — MORPHINE SULFATE (PF) 4 MG/ML IV SOLN
4.0000 mg | Freq: Once | INTRAVENOUS | Status: AC
  Administered 2023-06-10: 4 mg via INTRAVENOUS
  Filled 2023-06-09: qty 1

## 2023-06-09 MED ORDER — ACETAMINOPHEN 325 MG PO TABS: 650.0000 mg | ORAL_TABLET | Freq: Four times a day (QID) | ORAL | Status: DC | PRN

## 2023-06-09 MED ORDER — PANTOPRAZOLE SODIUM 40 MG PO TBEC
40.0000 mg | DELAYED_RELEASE_TABLET | Freq: Every day | ORAL | Status: DC
  Administered 2023-06-10 – 2023-06-11 (×2): 40 mg via ORAL
  Filled 2023-06-09 (×2): qty 1

## 2023-06-09 MED ORDER — ENOXAPARIN SODIUM 40 MG/0.4ML IJ SOSY
40.0000 mg | PREFILLED_SYRINGE | INTRAMUSCULAR | Status: DC
  Administered 2023-06-10 – 2023-06-11 (×2): 40 mg via SUBCUTANEOUS
  Filled 2023-06-09 (×2): qty 0.4

## 2023-06-09 MED ORDER — LORAZEPAM 2 MG/ML IJ SOLN
0.0000 mg | Freq: Four times a day (QID) | INTRAMUSCULAR | Status: DC
  Administered 2023-06-10: 1 mg via INTRAVENOUS
  Filled 2023-06-09: qty 1

## 2023-06-09 MED ORDER — SERTRALINE HCL 100 MG PO TABS
100.0000 mg | ORAL_TABLET | Freq: Every day | ORAL | Status: DC
  Administered 2023-06-10 – 2023-06-11 (×2): 100 mg via ORAL
  Filled 2023-06-09: qty 1

## 2023-06-09 MED ORDER — THIAMINE MONONITRATE 100 MG PO TABS
100.0000 mg | ORAL_TABLET | Freq: Every day | ORAL | Status: DC
  Administered 2023-06-10 – 2023-06-11 (×2): 100 mg via ORAL
  Filled 2023-06-09 (×2): qty 1

## 2023-06-09 MED ORDER — CLONIDINE HCL 0.1 MG PO TABS
0.1000 mg | ORAL_TABLET | Freq: Two times a day (BID) | ORAL | Status: DC
  Administered 2023-06-10 – 2023-06-11 (×4): 0.1 mg via ORAL
  Filled 2023-06-09 (×4): qty 1

## 2023-06-09 MED ORDER — ADULT MULTIVITAMIN W/MINERALS CH
1.0000 | ORAL_TABLET | Freq: Every day | ORAL | Status: DC
  Administered 2023-06-10 – 2023-06-11 (×2): 1 via ORAL
  Filled 2023-06-09 (×2): qty 1

## 2023-06-09 MED ORDER — FOLIC ACID 1 MG PO TABS
1.0000 mg | ORAL_TABLET | Freq: Every day | ORAL | Status: DC
  Administered 2023-06-10 – 2023-06-11 (×2): 1 mg via ORAL
  Filled 2023-06-09 (×2): qty 1

## 2023-06-09 MED ORDER — AMLODIPINE BESYLATE 10 MG PO TABS
10.0000 mg | ORAL_TABLET | Freq: Every day | ORAL | Status: DC
  Administered 2023-06-10 – 2023-06-11 (×2): 10 mg via ORAL
  Filled 2023-06-09 (×2): qty 1

## 2023-06-09 MED ORDER — TRAZODONE HCL 50 MG PO TABS
50.0000 mg | ORAL_TABLET | Freq: Every evening | ORAL | Status: DC | PRN
  Filled 2023-06-09: qty 1

## 2023-06-09 MED ORDER — LISINOPRIL 20 MG PO TABS
40.0000 mg | ORAL_TABLET | Freq: Every day | ORAL | Status: DC
  Administered 2023-06-10 – 2023-06-11 (×2): 40 mg via ORAL
  Filled 2023-06-09 (×2): qty 2

## 2023-06-09 MED ORDER — THIAMINE HCL 100 MG/ML IJ SOLN
100.0000 mg | Freq: Every day | INTRAMUSCULAR | Status: DC
  Filled 2023-06-09: qty 2

## 2023-06-09 MED ORDER — HYDROMORPHONE HCL 1 MG/ML IJ SOLN
0.5000 mg | INTRAMUSCULAR | Status: DC | PRN
  Administered 2023-06-10 (×3): 1 mg via INTRAVENOUS
  Filled 2023-06-09 (×3): qty 1

## 2023-06-09 MED ORDER — LORAZEPAM 1 MG PO TABS
1.0000 mg | ORAL_TABLET | ORAL | Status: DC | PRN
  Administered 2023-06-10: 1 mg via ORAL
  Filled 2023-06-09: qty 1

## 2023-06-09 MED ORDER — SODIUM CHLORIDE 0.9 % IV BOLUS
1000.0000 mL | Freq: Once | INTRAVENOUS | Status: AC
  Administered 2023-06-09: 1000 mL via INTRAVENOUS

## 2023-06-09 MED ORDER — ONDANSETRON HCL 4 MG/2ML IJ SOLN: 4.0000 mg | Freq: Four times a day (QID) | INTRAMUSCULAR | Status: DC | PRN

## 2023-06-09 MED ORDER — LORAZEPAM 2 MG/ML IJ SOLN: 1.0000 mg | INTRAMUSCULAR | Status: DC | PRN

## 2023-06-09 MED ORDER — LORAZEPAM 2 MG/ML IJ SOLN: 0.0000 mg | Freq: Two times a day (BID) | INTRAMUSCULAR | Status: DC

## 2023-06-09 NOTE — Progress Notes (Addendum)
Addendum:  Pt called and stated he has called Dr Marlou Porch office to see about rescheduled procedure, this at 1654.  Pt called this afternoon stated he has cold symptoms started yesterday without fever and taking robitussin. Told pt okay to take robitussin and come in for his surgery tomorrow 06-10-2023 , he will be assessed by anesthesia.

## 2023-06-09 NOTE — H&P (Signed)
History and Physical    Arthur Craig:[id number] DOB: [birth year] DOA: 06/09/2023  [facility]   Patient coming from: Home   Chief Complaint: Abdominal pain, N/V   HPI: Arthur Craig is a 62 y.o. male with medical history significant for hypertension, PUD, hepatic steatosis, prostate cancer, and alcoholism who presents with abdominal pain, nausea, and vomiting.  Patient developed acute onset of abdominal pain with nausea and vomiting yesterday.  Pain has been severe and radiates from upper abdomen through to his back.  He reports history of similar symptoms that were attributed to PUD.  He has been drinking 2 or three 40 oz beers daily.   ED Course: Upon arrival to the ED, patient is found to be afebrile and saturating well on room air with mild tachycardia and elevated blood pressure.  Labs are most notable for lipase 9380, AST 112, ALT 120, and ethanol 23.  CT findings are consistent with acute uncomplicated pancreatitis marked hepatic steatosis, and no biliary ductal dilatation.  Patient was treated in the ED with 1 L of NS, 2 doses of IV morphine, and Zofran.  Review of Systems:  All other systems reviewed and apart from HPI, are negative.  Past Medical History:  Diagnosis Date   Alcoholism (HCC)    Anemia    Anxiety    Blood transfusion without reported diagnosis 1984   for stomach ulcer   Fatty liver    GERD (gastroesophageal reflux disease)    H. pylori infection    sept 2022   Hypertension    Peptic ulcer    Prostate cancer (HCC)     Past Surgical History:  Procedure Laterality Date   FINGER SURGERY Right    index   removal of polyp from vocal cord     SKIN BIOPSY     mole removed from chin   TONSILLECTOMY     as child    Social History:   reports that he has been smoking cigarettes. He has never used smokeless tobacco. He reports that he does not currently use alcohol. He reports current drug use. Drug: "Crack" cocaine.  No Known  Allergies  Family History  Problem Relation Age of Onset   Diabetes Mother    Pancreatic cancer Father    Diabetes Father    Breast cancer Sister    Diabetes Sister    Hypertension Sister    Esophageal cancer Maternal Uncle    Diabetes Other        aunts and uncles   Hypertension Other        aunts and uncles   Stroke Other        aunts and uncles   Hyperlipidemia Other        aunts and uncles   Colon cancer Neg Hx    Rectal cancer Neg Hx    Stomach cancer Neg Hx      Prior to Admission medications   Medication Sig Start Date End Date Taking? Authorizing Provider  amLODipine (NORVASC) 10 MG tablet Take 10 mg by mouth daily.    [provider]  Cholecalciferol 50 MCG (2000 UT) TABS Take 1 tablet by mouth 2 (two) times daily. Patient not taking: Reported on 06/03/2023 11/28/20   [provider]  cloNIDine (CATAPRES) 0.1 MG tablet Take 0.1 mg by mouth daily. 01/08/21   [provider]  folic acid (FOLVITE) 1 MG tablet Take 1 mg by mouth daily. Patient not taking: Reported on 12/07/2022  [provider]  hydrochlorothiazide (HYDRODIURIL) 25 MG tablet  09/13/22   [provider]  lisinopril (ZESTRIL) 40 MG tablet TAKE ONE TABLET BY MOUTH DAILY FOR BLOOD PRESSURE 09/13/22   [provider]  methocarbamol (ROBAXIN) 750 MG tablet TAKE ONE TABLET BY MOUTH FOUR TIMES A DAY FOR MUSCLE PAIN/SPASMS (MAY CAUSE DROWSINESS) Patient not taking: Reported on 06/03/2023 11/17/22   [provider]  naloxone (NARCAN) nasal spray 4 mg/0.1 mL SPRAY 1 SPRAY INTO ONE NOSTRIL AS DIRECTED FOR OPIOID OVERDOSE - CALL 911 IMMEDIATELY, ADMINISTER DOSE, THEN TURN PERSON ON SIDE - IF NO RESPONSE IN 2-3 MINUTES OR PERSON RESPONDS BUT RELAPSES, REPEAT USING A NEW SPRAY DEVICE AND SPRAY INTO THE OTHER NOSTRIL 09/13/22   [provider]  naltrexone (DEPADE) 50 MG tablet Take 50 mg by mouth daily. Patient not taking: Reported on 06/03/2023 01/07/21   [provider]  omeprazole (PRILOSEC) 20 MG capsule as needed. 09/13/22   [provider]  sertraline (ZOLOFT) 100 MG tablet Take 100 mg by mouth as needed. 01/07/21   [provider]  traZODone (DESYREL) 50 MG tablet Take 50 mg by mouth at bedtime as needed. 01/07/21   [provider]  vitamin B-12 (CYANOCOBALAMIN) 100 MCG tablet Take 50 mcg by mouth as needed.    [provider]    Physical Exam: Vitals:   06/09/23 1941 06/09/23 1955 06/09/23 2240  BP: (!) 178/79  (!) 180/100  Pulse: 72  (!) 107  Resp: 18  16  Temp: 98.2 F (36.8 C)    TempSrc: Oral    SpO2: 100%  100%  Weight:  81.6 kg   Height:  5\' 10"  (1.778 m)     Constitutional: NAD, calm  Eyes: PERTLA, lids and conjunctivae normal ENMT: Mucous membranes are moist. Posterior pharynx clear of any exudate or lesions.   Neck: supple, no masses  Respiratory: no wheezing, no crackles. No accessory muscle use.  Cardiovascular: S1 & S2 heard, regular rate and rhythm. No extremity edema.   Abdomen: Soft. Tender in epigastrium. Bowel sounds active.  Musculoskeletal: no clubbing / cyanosis. No joint deformity upper and lower extremities.   Skin: no significant rashes, lesions, ulcers. Warm, dry, well-perfused. Neurologic: CN 2-12 grossly intact. Moving all extremities. Alert and oriented.  Psychiatric: Pleasant. Cooperative.    Labs and Imaging on Admission: I have personally reviewed following labs and imaging studies  CBC: Recent Labs  Lab 06/09/23 2017  WBC 6.5  HGB 15.1  HCT 45.2  MCV 91.5  PLT 141*   Basic Metabolic Panel: Recent Labs  Lab 06/09/23 2017  NA 132*  K 3.7  CL 94*  CO2 21*  GLUCOSE 167*  BUN 8  CREATININE 1.07  CALCIUM 9.6   GFR: Estimated Creatinine Clearance: 73 mL/min (by C-G formula based on SCr of 1.07 mg/dL). Liver Function Tests: Recent Labs  Lab 06/09/23 2017  AST 112*  ALT 120*  ALKPHOS 58  BILITOT 0.8  PROT 7.7  ALBUMIN 4.5   Recent Labs   Lab 06/09/23 2017  LIPASE 9,380*   No results for input(s): "AMMONIA" in the last 168 hours. Coagulation Profile: No results for input(s): "INR", "PROTIME" in the last 168 hours. Cardiac Enzymes: No results for input(s): "CKTOTAL", "CKMB", "CKMBINDEX", "TROPONINI" in the last 168 hours. BNP (last 3 results) No results for input(s): "PROBNP" in the last 8760 hours. HbA1C: No results for input(s): "HGBA1C" in the last 72 hours. CBG: No results for input(s): "GLUCAP" in  the last 168 hours. Lipid Profile: No results for input(s): "CHOL", "HDL", "LDLCALC", "TRIG", "CHOLHDL", "LDLDIRECT" in the last 72 hours. Thyroid Function Tests: No results for input(s): "TSH", "T4TOTAL", "FREET4", "T3FREE", "THYROIDAB" in the last 72 hours. Anemia Panel: No results for input(s): "VITAMINB12", "FOLATE", "FERRITIN", "TIBC", "IRON", "RETICCTPCT" in the last 72 hours. Urine analysis:    Component Value Date/Time   COLORURINE YELLOW 06/09/2023 2002   APPEARANCEUR CLEAR 06/09/2023 2002   LABSPEC 1.019 06/09/2023 2002   PHURINE 5.0 06/09/2023 2002   GLUCOSEU 150 (A) 06/09/2023 2002   HGBUR MODERATE (A) 06/09/2023 2002   BILIRUBINUR NEGATIVE 06/09/2023 2002   KETONESUR 20 (A) 06/09/2023 2002   PROTEINUR 100 (A) 06/09/2023 2002   UROBILINOGEN 1.0 02/25/2013 0935   NITRITE NEGATIVE 06/09/2023 2002   LEUKOCYTESUR NEGATIVE 06/09/2023 2002   Sepsis Labs: @LABRCNTIP (procalcitonin:4,lacticidven:4) )No results found for this or any previous visit (from the past 240 hour(s)).   Radiological Exams on Admission: CT ABDOMEN PELVIS W CONTRAST  Result Date: 06/09/2023 CLINICAL DATA:  Generalized abdominal pain with vomiting. Diagnosed with H pylori 2022. EXAM: CT ABDOMEN AND PELVIS WITH CONTRAST TECHNIQUE: Multidetector CT imaging of the abdomen and pelvis was performed using the standard protocol following bolus administration of intravenous contrast. RADIATION DOSE REDUCTION: This exam was performed according  to the departmental dose-optimization program which includes automated exposure control, adjustment of the mA and/or kV according to patient size and/or use of iterative reconstruction technique. CONTRAST:  OMNIPAQUE IOHEXOL 300 MG/ML  SOLN COMPARISON:  Abdominal ultrasound 04/16/2021 and CT abdomen and pelvis 09/24/2022 FINDINGS: Lower chest: No acute abnormality. Hepatobiliary: Marked hepatic steatosis. Normal gallbladder. No biliary dilation. Pancreas: Hazy fat stranding about the pancreatic head which is mildly edematous. No evidence of pancreatic necrosis. No ductal dilation. No organized fluid collection. Spleen: Unremarkable. Adrenals/Urinary Tract: Stable adrenal glands and kidneys. No obstructing urinary calculi or hydronephrosis. Unremarkable bladder. Stomach/Bowel: Normal caliber large and small bowel. Mild wall thickening about the duodenum. The appendix is normal.Stomach is within normal limits. Vascular/Lymphatic: Aortic atherosclerosis. No enlarged abdominal or pelvic lymph nodes. Reproductive: Unremarkable. Other: No free intraperitoneal air. Musculoskeletal: No acute fracture. IMPRESSION: 1. Acute uncomplicated interstitial pancreatitis. 2. Marked hepatic steatosis. Aortic Atherosclerosis (ICD10-I70.0). Electronically Signed   By: Minerva Fester M.D.   On: 06/09/2023 22:20     Assessment/Plan   1. Acute pancreatitis  - Etiology likely alcohol; transaminase elevations raise concern for biliary pancreatitis though could also be attributed to alcohol abuse/steatosis   - Check RUQ [place] and triglyceride level, continue bowel rest, IVF hydration, and pain-control    2. Hypertensive urgency  - No target-organ damage; anticipate improvement with pain-control  - Continue pain-control, continue Norvasc, clonidine, and lisinopril   3. Alcoholism  - Monitor with CIWA scoring, use Ativan if needed, supplement vitamins   4. Prostate cancer  - Planned for brachytherapy under the care of Dr.  Marlou Porch     DVT prophylaxis: Lovenox  Code Status: Full  Level of Care: Level of care: Med-Surg Family Communication: None present   Disposition Plan:  Patient is from: home  Anticipated d/c is to: Home  Anticipated d/c date is: 10/5 or 06/12/23  Patient currently: Pending pain-control, tolerance of adequate oral intake  Consults called: None  Admission status: Inpatient     Briscoe Deutscher, MD [facility]  06/09/2023, 11:51 PM

## 2023-06-09 NOTE — ED Provider Notes (Signed)
  Physical Exam  BP (!) 180/100   Pulse (!) 107   Temp 98.2 F (36.8 C) (Oral)   Resp 16   Ht 5\' 10"  (1.778 m)   Wt 81.6 kg   SpO2 100%   BMI 25.83 kg/m   Physical Exam  Procedures  Procedures  ED Course / MDM    Medical Decision Making Amount and/or Complexity of Data Reviewed Labs: ordered. Radiology: ordered.  Risk Prescription drug management. Decision regarding hospitalization.   Patient care signed out at shift handoff from [facility] [place]. See her note for full details. Patient presented to the emergency room with generalized abdominal pain with vomiting.  Patient does have history of GERD and H. pylori.  Patient also endorses drinking 2 40 ounce beers today.  He states he typically drinks this much daily. Patient treated with pain medication, fluids, and zofran. Sign out with disposition pending lab results and CT abdomen pelvis results.    Pertinent lab results include lipase 9380, UA positive for hemoglobin, AST 112, ALT 120, ethanol 23. Anion gap of 17  CT abdomen pelvis results: 1. Acute uncomplicated interstitial pancreatitis.  2. Marked hepatic steatosis.  No evidence of necrosis or infection. I agree with the radiologist findings.  Patient with acute pancreatitis.  Additional pain medications ordered.  Patient will need admission for further pain medication, IV fluids, antiemetics.  Requested consultation with the hospitalist for admission. Dr.Opyd agrees to see the patient for admission   Pamala Duffel 06/09/23 2345    Shon Baton, MD 06/10/23 [phone]

## 2023-06-09 NOTE — ED Provider Notes (Signed)
[place] [facility] DEPARTMENT AT [facility] Provider Note   CSN: [id number] Arrival date & time: 06/09/23  1857     History  Chief Complaint  Patient presents with   Abdominal Pain    Arthur Craig is a 62 y.o. male past medical history significant for prostate cancer, alcohol use disorder, H. pylori infection, peptic ulcer, hypertension, GERD, hyperlipidemia, gout presents today for abdominal pain.  States has been much worse today and he has a history of a peptic ulcer and H. pylori.  He endorses diarrhea x 4 today, vomiting x 1, and nausea.  He denies blood in his stool or vomit.    Abdominal Pain Associated symptoms: diarrhea, nausea and vomiting   Associated symptoms: no chest pain, no chills, no fever and no shortness of breath        Home Medications Prior to Admission medications   Medication Sig Start Date End Date Taking? Authorizing Provider  amLODipine (NORVASC) 10 MG tablet Take 10 mg by mouth daily.    [provider]  Cholecalciferol 50 MCG (2000 UT) TABS Take 1 tablet by mouth 2 (two) times daily. Patient not taking: Reported on 06/03/2023 11/28/20   [provider]  cloNIDine (CATAPRES) 0.1 MG tablet Take 0.1 mg by mouth daily. 01/08/21   [provider]  folic acid (FOLVITE) 1 MG tablet Take 1 mg by mouth daily. Patient not taking: Reported on 12/07/2022    [provider]  hydrochlorothiazide (HYDRODIURIL) 25 MG tablet  09/13/22   [provider]  lisinopril (ZESTRIL) 40 MG tablet TAKE ONE TABLET BY MOUTH DAILY FOR BLOOD PRESSURE 09/13/22   [provider]  methocarbamol (ROBAXIN) 750 MG tablet TAKE ONE TABLET BY MOUTH FOUR TIMES A DAY FOR MUSCLE PAIN/SPASMS (MAY CAUSE DROWSINESS) Patient not taking: Reported on 06/03/2023 11/17/22   [provider]  naloxone (NARCAN) nasal spray 4 mg/0.1 mL SPRAY 1 SPRAY INTO ONE NOSTRIL AS DIRECTED FOR OPIOID OVERDOSE - CALL 911 IMMEDIATELY, ADMINISTER DOSE,  THEN TURN PERSON ON SIDE - IF NO RESPONSE IN 2-3 MINUTES OR PERSON RESPONDS BUT RELAPSES, REPEAT USING A NEW SPRAY DEVICE AND SPRAY INTO THE OTHER NOSTRIL 09/13/22   [provider]  naltrexone (DEPADE) 50 MG tablet Take 50 mg by mouth daily. Patient not taking: Reported on 06/03/2023 01/07/21   [provider]  omeprazole (PRILOSEC) 20 MG capsule as needed. 09/13/22   [provider]  sertraline (ZOLOFT) 100 MG tablet Take 100 mg by mouth as needed. 01/07/21   [provider]  traZODone (DESYREL) 50 MG tablet Take 50 mg by mouth at bedtime as needed. 01/07/21   [provider]  vitamin B-12 (CYANOCOBALAMIN) 100 MCG tablet Take 50 mcg by mouth as needed.    [provider]      Allergies    Patient has no known allergies.    Review of Systems   Review of Systems  Constitutional:  Negative for chills and fever.  Respiratory:  Negative for shortness of breath.   Cardiovascular:  Negative for chest pain.  Gastrointestinal:  Positive for abdominal pain, diarrhea, nausea and vomiting.    Physical Exam Updated Vital Signs BP (!) 178/79 (BP Location: Left Arm)   Pulse 72   Temp 98.2 F (36.8 C) (Oral)   Resp 18   Ht 5\' 10"  (1.778 m)   Wt 81.6 kg   SpO2 100%   BMI 25.83 kg/m  Physical Exam Vitals and nursing note reviewed.  Constitutional:  General: He is not in acute distress.    Appearance: He is well-developed.  HENT:     Head: Normocephalic and atraumatic.  Eyes:     Conjunctiva/sclera: Conjunctivae normal.  Cardiovascular:     Rate and Rhythm: Normal rate and regular rhythm.     Heart sounds: No murmur heard. Pulmonary:     Effort: Pulmonary effort is normal. No respiratory distress.     Breath sounds: Normal breath sounds.  Abdominal:     General: Abdomen is protuberant. Bowel sounds are normal.     Palpations: Abdomen is soft.     Tenderness: There is abdominal tenderness in the periumbilical area. There is no guarding or  rebound. Positive signs include McBurney's sign. Negative signs include Rovsing's sign.  Musculoskeletal:        General: No swelling.     Cervical back: Neck supple.  Skin:    General: Skin is warm and dry.  Neurological:     General: No focal deficit present.     Mental Status: He is alert.  Psychiatric:        Mood and Affect: Mood normal.     ED Results / Procedures / Treatments   Labs (all labs ordered are listed, but only abnormal results are displayed) Labs Reviewed  COMPREHENSIVE METABOLIC PANEL - Abnormal; Notable for the following components:      Result Value   Sodium 132 (*)    Chloride 94 (*)    CO2 21 (*)    Glucose, Bld 167 (*)    AST 112 (*)    ALT 120 (*)    Anion gap 17 (*)    All other components within normal limits  CBC - Abnormal; Notable for the following components:   Platelets 141 (*)    All other components within normal limits  ETHANOL - Abnormal; Notable for the following components:   Alcohol, Ethyl (B) 23 (*)    All other components within normal limits  LIPASE, BLOOD  URINALYSIS, ROUTINE W REFLEX MICROSCOPIC    EKG None  Radiology No results found.  Procedures Procedures    Medications Ordered in ED Medications  sodium chloride 0.9 % bolus 1,000 mL (has no administration in time range)  ondansetron (ZOFRAN) injection 4 mg (4 mg Intravenous Given 06/09/23 2148)  morphine (PF) 4 MG/ML injection 4 mg (4 mg Intravenous Given 06/09/23 2144)  iohexol (OMNIPAQUE) 300 MG/ML solution 100 mL (100 mLs Intravenous Contrast Given 06/09/23 2159)    ED Course/ Medical Decision Making/ A&P                                 Medical Decision Making Amount and/or Complexity of Data Reviewed Labs: ordered.  This patient presents to the ED with chief complaint(s) of abdominal pain with pertinent past medical history of hypertension, alcohol use disorder, GERD, hyperlipidemia, gout, prostate cancer which further complicates the presenting complaint.  The complaint involves an extensive differential diagnosis and also carries with it a high risk of complications and morbidity.    The differential diagnosis includes peptic ulcer, H. pylori infection, prostate cancer metastasis, pancreatitis, appendicitis  ED Course and Reassessment: Patient given 4 mg morphine and 4 mg Zofran for pain/nausea  Independent labs interpretation:  The following labs were independently interpreted:  CBC: Mildly decreased platelets CMP: Mild hyponatremia elevated AST, ALT , hypochloremia, hypocapnia.  Elevated AST, ALT consistent with alcohol use disorder, mildly elevated anion  gap Lipase: UA:  Independent visualization of imaging: - I independently visualized the following imaging with scope of interpretation limited to determining acute life threatening conditions related to emergency care: CT abdomen pelvis with contrast, which revealed pending  Consultation: - Consulted or discussed management/test interpretation w/ external professional:   Consideration for admission or further workup:  Patient signed out to [facility] as patient is still pending labs and CT scans        Final Clinical Impression(s) / ED Diagnoses Final diagnoses:  None    Rx / DC Orders ED Discharge Orders     None         Dolphus Jenny, PA-C 06/09/23 2206    Glyn Ade, MD 06/14/23 1255

## 2023-06-09 NOTE — ED Triage Notes (Addendum)
Pt arrived POV with c/o generalized abd pain with vomiting. Reports dx with H. Pylori in 2022, reports never got over it, pt report has had some earlier alcohol today and doesn't know if that started his abd pain as he is taking medication that he is not supposed to be drinking with. Denies diarrhea, CP, or SOB.   Pt has appt tomorrow with urology for his prostate.

## 2023-06-09 NOTE — Telephone Encounter (Signed)
Called patient to remind of procedure for 06-10-23, spoke with patient's daughter Danielle Dess and she is aware of this procedure

## 2023-06-10 ENCOUNTER — Other Ambulatory Visit: Payer: Self-pay

## 2023-06-10 ENCOUNTER — Ambulatory Visit (HOSPITAL_BASED_OUTPATIENT_CLINIC_OR_DEPARTMENT_OTHER)
Admission: RE | Admit: 2023-06-10 | Payer: No Typology Code available for payment source | Source: Ambulatory Visit | Admitting: Urology

## 2023-06-10 ENCOUNTER — Encounter (HOSPITAL_COMMUNITY)
Admission: EM | Disposition: A | Discharge status: Home / Self Care | Payer: Self-pay | Source: Home / Self Care | Attending: Internal Medicine

## 2023-06-10 ENCOUNTER — Inpatient Hospital Stay (HOSPITAL_COMMUNITY): Payer: No Typology Code available for payment source

## 2023-06-10 DIAGNOSIS — Z01818 Encounter for other preprocedural examination: Secondary | ICD-10-CM

## 2023-06-10 DIAGNOSIS — K852 Alcohol induced acute pancreatitis without necrosis or infection: Secondary | ICD-10-CM | POA: Diagnosis not present

## 2023-06-10 DIAGNOSIS — I160 Hypertensive urgency: Secondary | ICD-10-CM

## 2023-06-10 HISTORY — DX: Other specified bacterial intestinal infections: A04.8

## 2023-06-10 HISTORY — DX: Malignant neoplasm of prostate: C61

## 2023-06-10 HISTORY — DX: Fatty (change of) liver, not elsewhere classified: K76.0

## 2023-06-10 HISTORY — DX: Gastro-esophageal reflux disease without esophagitis: K21.9

## 2023-06-10 LAB — COMPREHENSIVE METABOLIC PANEL
ALT: 97 U/L — ABNORMAL HIGH (ref 0–44)
AST: 73 U/L — ABNORMAL HIGH (ref 15–41)
Albumin: 4 g/dL (ref 3.5–5.0)
Alkaline Phosphatase: 52 U/L (ref 38–126)
Anion gap: 11 (ref 5–15)
BUN: 10 mg/dL (ref 8–23)
CO2: 24 mmol/L (ref 22–32)
Calcium: 9.2 mg/dL (ref 8.9–10.3)
Chloride: 97 mmol/L — ABNORMAL LOW (ref 98–111)
Creatinine, Ser: 0.98 mg/dL (ref 0.61–1.24)
GFR, Estimated: >60 mL/min (ref >60)
Glucose, Bld: 108 mg/dL — ABNORMAL HIGH (ref 70–99)
Potassium: 3.9 mmol/L (ref 3.5–5.1)
Sodium: 132 mmol/L — ABNORMAL LOW (ref 135–145)
Total Bilirubin: 1 mg/dL (ref 0.3–1.2)
Total Protein: 7.4 g/dL (ref 6.5–8.1)

## 2023-06-10 LAB — HIV ANTIBODY (ROUTINE TESTING W REFLEX): HIV Screen 4th Generation wRfx: NONREACTIVE

## 2023-06-10 LAB — CBC
HCT: 43.8 % (ref 39.0–52.0)
Hemoglobin: 14.6 g/dL (ref 13.0–17.0)
MCH: 30.3 pg (ref 26.0–34.0)
MCHC: 33.3 g/dL (ref 30.0–36.0)
MCV: 90.9 fL (ref 80.0–100.0)
Platelets: 144 10*3/uL — ABNORMAL LOW (ref 150–400)
RBC: 4.82 MIL/uL (ref 4.22–5.81)
RDW: 14.6 % (ref 11.5–15.5)
WBC: 5.1 10*3/uL (ref 4.0–10.5)
nRBC: 0 % (ref 0.0–0.2)

## 2023-06-10 LAB — MAGNESIUM: Magnesium: 2.1 mg/dL (ref 1.7–2.4)

## 2023-06-10 LAB — TRIGLYCERIDES: Triglycerides: 60 mg/dL (ref <150)

## 2023-06-10 SURGERY — INSERTION, RADIATION SOURCE, PROSTATE
Anesthesia: General

## 2023-06-10 MED ORDER — HYDROCHLOROTHIAZIDE 25 MG PO TABS
25.0000 mg | ORAL_TABLET | Freq: Every day | ORAL | Status: DC
  Administered 2023-06-10 – 2023-06-11 (×2): 25 mg via ORAL
  Filled 2023-06-10 (×3): qty 1

## 2023-06-10 NOTE — Progress Notes (Addendum)
PROGRESS NOTE    Arthur Craig  ZOX:[id number] DOB: [birth year] DOA: 06/09/2023 PCP: [facility]     Brief Narrative:  Arthur Craig is a 62 year old male with past medical history significant for hypertension, peptic ulcer disease, hepatic steatosis, prostate cancer, alcohol abuse who presents with abdominal pain, nausea and vomiting.  Found to have elevated lipase 9380 as well as CT abdomen consistent with acute uncomplicated pancreatitis.  Admits that he drinks 2-3 40 ounce beer daily and has been doing so for many years.  New events last 24 hours / Subjective: Pain controlled with pain medication today.  Wants to eat.  Assessment & Plan:   Principal Problem:   Acute pancreatitis Active Problems:   Essential hypertension   Personal history of peptic ulcer disease   Alcoholism (HCC)   Malignant neoplasm of prostate (HCC)   Hypertensive urgency   Acute alcoholic pancreatitis -Abdominal ultrasound negative for cholelithiasis -TG 60 -IV fluid, can be on clear liquid diet  Alcohol abuse -CIWA for alcohol withdrawal  Hypertension  -Norvasc, Catapres, lisinopril -Added HCTZ for better blood pressure control    DVT prophylaxis:  enoxaparin (LOVENOX) injection 40 mg Start: 06/10/23 1000  Code Status: Full code Family Communication: None Disposition Plan: Home Status is: Inpatient Remains inpatient appropriate because: IV fluid    Antimicrobials:  Anti-infectives (From admission, onward)    None        Objective: Vitals:   06/10/23 0833 06/10/23 0840 06/10/23 0946 06/10/23 1341  BP: (!) 143/113  (!) 159/98 (!) 158/101  Pulse: (!) 114 (!) 101 97 86  Resp: 15  16 18   Temp: 99 F (37.2 C)  98.6 F (37 C) 98.4 F (36.9 C)  TempSrc: Oral  Oral Oral  SpO2: 98%  97% 99%  Weight:      Height:        Intake/Output Summary (Last 24 hours) at 06/10/2023 1438 Last data filed at 06/10/2023 1400 Gross per 24 hour  Intake 1401.71 ml  Output 0 ml   Net 1401.71 ml   Filed Weights   06/09/23 1955  Weight: 81.6 kg    Examination:  General exam: Appears calm and comfortable  Respiratory system: Clear to auscultation. Respiratory effort normal. No respiratory distress. No conversational dyspnea.  Cardiovascular system: S1 & S2 heard, RRR. No murmurs. No pedal edema. Gastrointestinal system: Abdomen is nondistended, soft and nontender. Normal bowel sounds heard. Central nervous system: Alert and oriented. No focal neurological deficits. Speech clear.  Extremities: Symmetric in appearance  Skin: No rashes, lesions or ulcers on exposed skin  Psychiatry: Judgement and insight appear normal. Mood & affect appropriate.   Data Reviewed: I have personally reviewed following labs and imaging studies  CBC: Recent Labs  Lab 06/09/23 2017 06/10/23 0853  WBC 6.5 5.1  HGB 15.1 14.6  HCT 45.2 43.8  MCV 91.5 90.9  PLT 141* 144*   Basic Metabolic Panel: Recent Labs  Lab 06/09/23 2017 06/10/23 0853  NA 132* 132*  K 3.7 3.9  CL 94* 97*  CO2 21* 24  GLUCOSE 167* 108*  BUN 8 10  CREATININE 1.07 0.98  CALCIUM 9.6 9.2  MG  --  2.1   GFR: Estimated Creatinine Clearance: 78.6 mL/min (by C-G formula based on SCr of 0.98 mg/dL). Liver Function Tests: Recent Labs  Lab 06/09/23 2017 06/10/23 0853  AST 112* 73*  ALT 120* 97*  ALKPHOS 58 52  BILITOT 0.8 1.0  PROT 7.7 7.4  ALBUMIN 4.5 4.0  Recent Labs  Lab 06/09/23 2017  LIPASE 9,380*   No results for input(s): "AMMONIA" in the last 168 hours. Coagulation Profile: No results for input(s): "INR", "PROTIME" in the last 168 hours. Cardiac Enzymes: No results for input(s): "CKTOTAL", "CKMB", "CKMBINDEX", "TROPONINI" in the last 168 hours. BNP (last 3 results) No results for input(s): "PROBNP" in the last 8760 hours. HbA1C: No results for input(s): "HGBA1C" in the last 72 hours. CBG: No results for input(s): "GLUCAP" in the last 168 hours. Lipid Profile: Recent Labs     06/10/23 0853  TRIG 60   Thyroid Function Tests: No results for input(s): "TSH", "T4TOTAL", "FREET4", "T3FREE", "THYROIDAB" in the last 72 hours. Anemia Panel: No results for input(s): "VITAMINB12", "FOLATE", "FERRITIN", "TIBC", "IRON", "RETICCTPCT" in the last 72 hours. Sepsis Labs: No results for input(s): "PROCALCITON", "LATICACIDVEN" in the last 168 hours.  No results found for this or any previous visit (from the past 240 hour(s)).    Radiology Studies: US Abdomen Limited RUQ (LIVER/GB)  Result Date: 06/10/2023 CLINICAL DATA:  Acute pancreatitis. EXAM: ULTRASOUND ABDOMEN LIMITED RIGHT UPPER QUADRANT COMPARISON:  CT with IV contrast yesterday. FINDINGS: Gallbladder: No gallstones or wall thickening visualized. No sonographic Murphy sign noted by sonographer. Common bile duct: Diameter: 4.5 mm with no intrahepatic biliary prominence. Liver: No focal lesion identified. The liver is diffusely echogenic consistent with the moderate to severe fatty replacement noted on CT. Portal vein is patent on color Doppler imaging with normal direction of blood flow towards the liver. Other: None. IMPRESSION: No gallstones or biliary dilatation. Fatty liver. Electronically Signed   By: Almira Bar M.D.   On: 06/10/2023 00:35   CT ABDOMEN PELVIS W CONTRAST  Result Date: 06/09/2023 CLINICAL DATA:  Generalized abdominal pain with vomiting. Diagnosed with H pylori 2022. EXAM: CT ABDOMEN AND PELVIS WITH CONTRAST TECHNIQUE: Multidetector CT imaging of the abdomen and pelvis was performed using the standard protocol following bolus administration of intravenous contrast. RADIATION DOSE REDUCTION: This exam was performed according to the departmental dose-optimization program which includes automated exposure control, adjustment of the mA and/or kV according to patient size and/or use of iterative reconstruction technique. CONTRAST:  OMNIPAQUE IOHEXOL 300 MG/ML  SOLN COMPARISON:  Abdominal ultrasound  04/16/2021 and CT abdomen and pelvis 09/24/2022 FINDINGS: Lower chest: No acute abnormality. Hepatobiliary: Marked hepatic steatosis. Normal gallbladder. No biliary dilation. Pancreas: Hazy fat stranding about the pancreatic head which is mildly edematous. No evidence of pancreatic necrosis. No ductal dilation. No organized fluid collection. Spleen: Unremarkable. Adrenals/Urinary Tract: Stable adrenal glands and kidneys. No obstructing urinary calculi or hydronephrosis. Unremarkable bladder. Stomach/Bowel: Normal caliber large and small bowel. Mild wall thickening about the duodenum. The appendix is normal.Stomach is within normal limits. Vascular/Lymphatic: Aortic atherosclerosis. No enlarged abdominal or pelvic lymph nodes. Reproductive: Unremarkable. Other: No free intraperitoneal air. Musculoskeletal: No acute fracture. IMPRESSION: 1. Acute uncomplicated interstitial pancreatitis. 2. Marked hepatic steatosis. Aortic Atherosclerosis (ICD10-I70.0). Electronically Signed   By: Minerva Fester M.D.   On: 06/09/2023 22:20      Scheduled Meds:  amLODipine  10 mg Oral Daily   cloNIDine  0.1 mg Oral BID   enoxaparin (LOVENOX) injection  40 mg Subcutaneous Q24H   folic acid  1 mg Oral Daily   hydrochlorothiazide  25 mg Oral Daily   lisinopril  40 mg Oral Daily   LORazepam  0-4 mg Intravenous Q6H   Followed by   Melene Muller ON 06/12/2023] LORazepam  0-4 mg Intravenous Q12H   multivitamin with minerals  1 tablet Oral Daily   pantoprazole  40 mg Oral Daily   sertraline  100 mg Oral Daily   thiamine  100 mg Oral Daily   Or   thiamine  100 mg Intravenous Daily   Continuous Infusions:  lactated ringers 125 mL/hr at 06/10/23 0843     LOS: 1 day   Time spent: 25 minutes   Noralee Stain, DO [facility] 06/10/2023, 2:38 PM   Available via Epic secure chat 7am-7pm After these hours, please refer to coverage provider listed on [email or web address]

## 2023-06-10 NOTE — ED Notes (Signed)
ED TO INPATIENT HANDOFF REPORT  Name/Age/Gender Arthur Craig 62 y.o. male  Code Status    Code Status Orders  (From admission, onward)           Start     Ordered   06/09/23 2351  Full code  Continuous       Question:  By:  Answer:  Consent: discussion documented in EHR   06/09/23 2351           Code Status History     This patient has a current code status but no historical code status.       Home/SNF/Other Home  Chief Complaint Acute pancreatitis [K85.90]  Level of Care/Admitting Diagnosis ED Disposition     ED Disposition  Admit   Condition  --   Comment  Hospital Area: [facility] [[id number]]  Level of Care: Med-Surg [16]  May admit patient to [facility] or [facility] if equivalent level of care is available:: Yes  Covid Evaluation: Asymptomatic - no recent exposure (last 10 days) testing not required  Diagnosis: Acute pancreatitis [577.0.ICD-9-CM]  Admitting Physician: Briscoe Deutscher [[id number]]  Attending Physician: Briscoe Deutscher [[id number]]  Certification:: I certify this patient will need inpatient services for at least 2 midnights  Expected Medical Readiness: 06/11/2023          Medical History Past Medical History:  Diagnosis Date   Alcoholism (HCC)    Anemia    Anxiety    Blood transfusion without reported diagnosis 1984   for stomach ulcer   Fatty liver    GERD (gastroesophageal reflux disease)    H. pylori infection    sept 2022   Hypertension    Peptic ulcer    Prostate cancer (HCC)     Allergies No Known Allergies  IV Location/Drains/Wounds Patient Lines/Drains/Airways Status     Active Line/Drains/Airways     Name Placement date Placement time Site Days   Peripheral IV 06/09/23 20 G 1" Left Forearm 06/09/23  2142  Forearm  1            Labs/Imaging Results for orders placed or performed during the hospital encounter of 06/09/23 (from the past 48 hour(s))  Urinalysis, Routine w reflex  microscopic -Urine, Clean Catch     Status: Abnormal   Collection Time: 06/09/23  8:02 PM  Result Value Ref Range   Color, Urine YELLOW YELLOW   APPearance CLEAR CLEAR   Specific Gravity, Urine 1.019 1.005 - 1.030   pH 5.0 5.0 - 8.0   Glucose, UA 150 (A) NEGATIVE mg/dL   Hgb urine dipstick MODERATE (A) NEGATIVE   Bilirubin Urine NEGATIVE NEGATIVE   Ketones, ur 20 (A) NEGATIVE mg/dL   Protein, ur 191 (A) NEGATIVE mg/dL   Nitrite NEGATIVE NEGATIVE   Leukocytes,Ua NEGATIVE NEGATIVE   RBC / HPF 0-5 0 - 5 RBC/hpf   WBC, UA 0-5 0 - 5 WBC/hpf   Bacteria, UA NONE SEEN NONE SEEN   Squamous Epithelial / HPF 0-5 0 - 5 /HPF   Mucus PRESENT    Hyaline Casts, UA PRESENT     Comment: Performed at [facility], [address], [place], [place] [address]  Lipase, blood     Status: Abnormal   Collection Time: 06/09/23  8:17 PM  Result Value Ref Range   Lipase 9,380 (H) 11 - 51 U/L    Comment: RESULT CONFIRMED BY MANUAL DILUTION Performed at [facility], [address]  [place], [place] [address]   Comprehensive metabolic panel     Status: Abnormal   Collection Time: 06/09/23  8:17 PM  Result Value Ref Range   Sodium 132 (L) 135 - 145 mmol/L   Potassium 3.7 3.5 - 5.1 mmol/L   Chloride 94 (L) 98 - 111 mmol/L   CO2 21 (L) 22 - 32 mmol/L   Glucose, Bld 167 (H) 70 - 99 mg/dL    Comment: Glucose reference range applies only to samples taken after fasting for at least 8 hours.   BUN 8 8 - 23 mg/dL   Creatinine, Ser 6.04 0.61 - 1.24 mg/dL   Calcium 9.6 8.9 - 54.0 mg/dL   Total Protein 7.7 6.5 - 8.1 g/dL   Albumin 4.5 3.5 - 5.0 g/dL   AST 981 (H) 15 - 41 U/L   ALT 120 (H) 0 - 44 U/L   Alkaline Phosphatase 58 38 - 126 U/L   Total Bilirubin 0.8 0.3 - 1.2 mg/dL   GFR, Estimated >19 >14 mL/min    Comment: (NOTE) Calculated using the CKD-EPI Creatinine Equation (2021)    Anion gap 17 (H) 5 - 15    Comment: Performed at [facility], [address] [place], [place] [address]  CBC     Status: Abnormal   Collection Time: 06/09/23  8:17 PM  Result Value Ref Range   WBC 6.5 4.0 - 10.5 K/uL   RBC 4.94 4.22 - 5.81 MIL/uL   Hemoglobin 15.1 13.0 - 17.0 g/dL   HCT 62.1 30.8 - 65.7 %   MCV 91.5 80.0 - 100.0 fL   MCH 30.6 26.0 - 34.0 pg   MCHC 33.4 30.0 - 36.0 g/dL   RDW 84.6 96.2 - 95.2 %   Platelets 141 (L) 150 - 400 K/uL   nRBC 0.0 0.0 - 0.2 %    Comment: Performed at [facility], [address], [place], [place] [address]  Ethanol     Status: Abnormal   Collection Time: 06/09/23  8:21 PM  Result Value Ref Range   Alcohol, Ethyl (B) 23 (H) <10 mg/dL    Comment: (NOTE) Lowest detectable limit for serum alcohol is 10 mg/dL.  For medical purposes only. Performed at [facility], [address], [place], [place] [address]    US Abdomen Limited RUQ (LIVER/GB)  Result Date: 06/10/2023 CLINICAL DATA:  Acute pancreatitis. EXAM: ULTRASOUND ABDOMEN LIMITED RIGHT UPPER QUADRANT COMPARISON:  CT with IV contrast yesterday. FINDINGS: Gallbladder: No gallstones or wall thickening visualized. No sonographic Murphy sign noted by sonographer. Common bile duct: Diameter: 4.5 mm with no intrahepatic biliary prominence. Liver: No focal lesion identified. The liver is diffusely echogenic consistent with the moderate to severe fatty replacement noted on CT. Portal vein is patent on color Doppler imaging with normal direction of blood flow towards the liver. Other: None. IMPRESSION: No gallstones or biliary dilatation. Fatty liver. Electronically Signed   By: Almira Bar M.D.   On: 06/10/2023 00:35   CT ABDOMEN PELVIS W CONTRAST  Result Date: 06/09/2023 CLINICAL DATA:  Generalized abdominal pain with vomiting. Diagnosed with H pylori 2022. EXAM: CT ABDOMEN AND PELVIS WITH CONTRAST TECHNIQUE: Multidetector CT imaging of the abdomen and pelvis was performed using the standard protocol following bolus  administration of intravenous contrast. RADIATION DOSE REDUCTION: This exam was performed according to the departmental dose-optimization program which includes automated exposure control, adjustment of the mA and/or kV according to patient size and/or use of  iterative reconstruction technique. CONTRAST:  OMNIPAQUE IOHEXOL 300 MG/ML  SOLN COMPARISON:  Abdominal ultrasound 04/16/2021 and CT abdomen and pelvis 09/24/2022 FINDINGS: Lower chest: No acute abnormality. Hepatobiliary: Marked hepatic steatosis. Normal gallbladder. No biliary dilation. Pancreas: Hazy fat stranding about the pancreatic head which is mildly edematous. No evidence of pancreatic necrosis. No ductal dilation. No organized fluid collection. Spleen: Unremarkable. Adrenals/Urinary Tract: Stable adrenal glands and kidneys. No obstructing urinary calculi or hydronephrosis. Unremarkable bladder. Stomach/Bowel: Normal caliber large and small bowel. Mild wall thickening about the duodenum. The appendix is normal.Stomach is within normal limits. Vascular/Lymphatic: Aortic atherosclerosis. No enlarged abdominal or pelvic lymph nodes. Reproductive: Unremarkable. Other: No free intraperitoneal air. Musculoskeletal: No acute fracture. IMPRESSION: 1. Acute uncomplicated interstitial pancreatitis. 2. Marked hepatic steatosis. Aortic Atherosclerosis (ICD10-I70.0). Electronically Signed   By: Minerva Fester M.D.   On: 06/09/2023 22:20    Pending Labs Unresulted Labs (From admission, onward)     Start     Ordered   06/16/23 0500  Creatinine, serum  (enoxaparin (LOVENOX)    CrCl >/= 30 ml/min)  Weekly,   R     Comments: while on enoxaparin therapy    06/09/23 2351   06/10/23 0500  HIV Antibody (routine testing w rflx)  (HIV Antibody (Routine testing w reflex) panel)  Tomorrow morning,   R        06/09/23 2351   06/10/23 0500  Magnesium  Tomorrow morning,   R        06/09/23 2351   06/10/23 0500  Comprehensive metabolic panel  Daily,   R       06/09/23 2351   06/10/23 0500  CBC  Daily,   R      06/09/23 2351   06/10/23 0500  Triglycerides  Tomorrow morning,   R        06/09/23 2351            Vitals/Pain Today's Vitals   06/10/23 0430 06/10/23 0431 06/10/23 0431 06/10/23 0645  BP: (!) 176/100   (!) 189/108  Pulse: 100   94  Resp: 18   (!) 21  Temp:   98.5 F (36.9 C)   TempSrc:   Oral   SpO2: 97%   98%  Weight:      Height:      PainSc:  5       Isolation Precautions No active isolations  Medications Medications  amLODipine (NORVASC) tablet 10 mg (has no administration in time range)  cloNIDine (CATAPRES) tablet 0.1 mg (0.1 mg Oral Given 06/10/23 0220)  lisinopril (ZESTRIL) tablet 40 mg (has no administration in time range)  traZODone (DESYREL) tablet 50 mg (has no administration in time range)  sertraline (ZOLOFT) tablet 100 mg (has no administration in time range)  pantoprazole (PROTONIX) EC tablet 40 mg (has no administration in time range)  LORazepam (ATIVAN) tablet 1-4 mg (has no administration in time range)    Or  LORazepam (ATIVAN) injection 1-4 mg (has no administration in time range)  thiamine (VITAMIN B1) tablet 100 mg (has no administration in time range)    Or  thiamine (VITAMIN B1) injection 100 mg (has no administration in time range)  folic acid (FOLVITE) tablet 1 mg (has no administration in time range)  multivitamin with minerals tablet 1 tablet (has no administration in time range)  LORazepam (ATIVAN) injection 0-4 mg ( Intravenous Not Given 06/10/23 0655)    Followed by  LORazepam (ATIVAN) injection 0-4 mg (has no administration in time  range)  enoxaparin (LOVENOX) injection 40 mg (has no administration in time range)  acetaminophen (TYLENOL) tablet 650 mg (has no administration in time range)    Or  acetaminophen (TYLENOL) suppository 650 mg (has no administration in time range)  HYDROmorphone (DILAUDID) injection 0.5-1 mg (1 mg Intravenous Given 06/10/23 0108)  ondansetron (ZOFRAN)  tablet 4 mg (has no administration in time range)    Or  ondansetron (ZOFRAN) injection 4 mg (has no administration in time range)  lactated ringers infusion ( Intravenous Infusion Verify 06/10/23 0652)  ondansetron (ZOFRAN) injection 4 mg (4 mg Intravenous Given 06/09/23 2148)  morphine (PF) 4 MG/ML injection 4 mg (4 mg Intravenous Given 06/09/23 2144)  sodium chloride 0.9 % bolus 1,000 mL (0 mLs Intravenous Stopped 06/10/23 0055)  iohexol (OMNIPAQUE) 300 MG/ML solution 100 mL (100 mLs Intravenous Contrast Given 06/09/23 2159)  morphine (PF) 4 MG/ML injection 4 mg (4 mg Intravenous Given 06/10/23 0002)    Mobility walks

## 2023-06-10 NOTE — Plan of Care (Signed)
  Problem: Education: Goal: Knowledge of General Education information will improve Description: Including pain rating scale, medication(s)/side effects and non-pharmacologic comfort measures Outcome: Progressing   Problem: Clinical Measurements: Goal: Ability to maintain clinical measurements within normal limits will improve Outcome: Progressing   

## 2023-06-10 NOTE — Progress Notes (Addendum)
   06/10/23 1302  TOC Brief Assessment  Insurance and Status Reviewed  Patient has primary care physician Yes  Home environment has been reviewed home alone  Prior level of function: independent  Prior/Current Home Services No current home services  Social Determinants of Health Reivew SDOH reviewed no interventions necessary  Readmission risk has been reviewed Yes  Transition of care needs transition of care needs identified, TOC will continue to follow   Received [facility] referral for SA counseling review with pt.  Met with pt who agrees that he has struggled with ETOH but quickly states, "after that pain I just had I'm staying away from that (alcohol)."  He is receptive to CSW adding SA treatment resource info to AVS.  He states, "I know I've got to make some changes."  No further TOC needs at this time.

## 2023-06-11 DIAGNOSIS — K852 Alcohol induced acute pancreatitis without necrosis or infection: Secondary | ICD-10-CM | POA: Diagnosis not present

## 2023-06-11 LAB — COMPREHENSIVE METABOLIC PANEL
ALT: 68 U/L — ABNORMAL HIGH (ref 0–44)
AST: 47 U/L — ABNORMAL HIGH (ref 15–41)
Albumin: 3.6 g/dL (ref 3.5–5.0)
Alkaline Phosphatase: 45 U/L (ref 38–126)
Anion gap: 12 (ref 5–15)
BUN: 11 mg/dL (ref 8–23)
CO2: 26 mmol/L (ref 22–32)
Calcium: 9.2 mg/dL (ref 8.9–10.3)
Chloride: 91 mmol/L — ABNORMAL LOW (ref 98–111)
Creatinine, Ser: 0.99 mg/dL (ref 0.61–1.24)
GFR, Estimated: >60 mL/min (ref >60)
Glucose, Bld: 107 mg/dL — ABNORMAL HIGH (ref 70–99)
Potassium: 3.6 mmol/L (ref 3.5–5.1)
Sodium: 129 mmol/L — ABNORMAL LOW (ref 135–145)
Total Bilirubin: 1.3 mg/dL — ABNORMAL HIGH (ref 0.3–1.2)
Total Protein: 6.5 g/dL (ref 6.5–8.1)

## 2023-06-11 MED ORDER — ADULT MULTIVITAMIN W/MINERALS CH: 1.0000 | ORAL_TABLET | Freq: Every day | ORAL | Status: AC

## 2023-06-11 MED ORDER — CLONIDINE HCL 0.1 MG PO TABS: 0.1000 mg | ORAL_TABLET | Freq: Two times a day (BID) | ORAL | 1 refills | Status: AC

## 2023-06-11 MED ORDER — FOLIC ACID 1 MG PO TABS: 1.0000 mg | ORAL_TABLET | Freq: Every day | ORAL | 1 refills | Status: AC

## 2023-06-11 MED ORDER — VITAMIN B-1 100 MG PO TABS: 100.0000 mg | ORAL_TABLET | Freq: Every day | ORAL | 1 refills | Status: AC

## 2023-06-11 NOTE — Discharge Summary (Signed)
Physician Discharge Summary  Arthur Craig HQI:[id number] DOB: Jan 06, 1961 DOA: 06/09/2023  PCP: [facility]  Admit date: 06/09/2023 Discharge date: 06/11/2023  Admitted From: Home Disposition:  Home   Recommendations for Outpatient Follow-up:  Follow up with PCP   Discharge Condition: Stable CODE STATUS: Full  Diet recommendation: Soft diet  Brief/Interim Summary: Arthur Craig is a 62 year old male with past medical history significant for hypertension, peptic ulcer disease, hepatic steatosis, prostate cancer, alcohol abuse who presents with abdominal pain, nausea and vomiting.  Found to have elevated lipase 9380 as well as CT abdomen consistent with acute uncomplicated pancreatitis.  Admits that he drinks 2-3 40 ounce beer daily and has been doing so for many years.  Patient was managed conservatively with NPO and IVF as well as pain control. He had clinical improvement and was discharged home. He was advised to stop alcohol use.   Discharge Diagnoses:   Principal Problem:   Acute pancreatitis Active Problems:   Essential hypertension   Personal history of peptic ulcer disease   Alcoholism (HCC)   Malignant neoplasm of prostate (HCC)   Hypertensive urgency     Acute alcoholic pancreatitis -Abdominal ultrasound negative for cholelithiasis -TG 60 -Improving   Alcohol abuse -CIWA for alcohol withdrawal. No sign of withdrawal on my exam today.    Hypertension  -Norvasc, Catapres, lisinopril, hydrochlorothiazide   Discharge Instructions  Discharge Instructions     Call MD for:  difficulty breathing, headache or visual disturbances   Complete by: As directed    Call MD for:  extreme fatigue   Complete by: As directed    Call MD for:  persistant dizziness or light-headedness   Complete by: As directed    Call MD for:  persistant nausea and vomiting   Complete by: As directed    Call MD for:  severe uncontrolled pain   Complete by: As directed    Call MD  for:  temperature >100.4   Complete by: As directed    Discharge instructions   Complete by: As directed    You were cared for by a hospitalist during your hospital stay. If you have any questions about your discharge medications or the care you received while you were in the hospital after you are discharged, you can call the unit and ask to speak with the hospitalist on call if the hospitalist that took care of you is not available. Once you are discharged, your primary care physician will handle any further medical issues. Please note that NO REFILLS for any discharge medications will be authorized once you are discharged, as it is imperative that you return to your primary care physician (or establish a relationship with a primary care physician if you do not have one) for your aftercare needs so that they can reassess your need for medications and monitor your lab values.   Increase activity slowly   Complete by: As directed       Allergies as of 06/11/2023   No Known Allergies      Medication List     STOP taking these medications    Cholecalciferol 50 MCG (2000 UT) Tabs   methocarbamol 750 MG tablet Commonly known as: ROBAXIN   naltrexone 50 MG tablet Commonly known as: DEPADE   tamsulosin 0.4 MG Caps capsule Commonly known as: FLOMAX   vitamin B-12 100 MCG tablet Commonly known as: CYANOCOBALAMIN       TAKE these medications    amLODipine 10 MG tablet Commonly  known as: NORVASC Take 10 mg by mouth daily.   cloNIDine 0.1 MG tablet Commonly known as: CATAPRES Take 1 tablet (0.1 mg total) by mouth 2 (two) times daily. What changed: when to take this   folic acid 1 MG tablet Commonly known as: FOLVITE Take 1 tablet (1 mg total) by mouth daily.   hydrochlorothiazide 25 MG tablet Commonly known as: HYDRODIURIL   lisinopril 40 MG tablet Commonly known as: ZESTRIL TAKE ONE TABLET BY MOUTH DAILY FOR BLOOD PRESSURE   multivitamin with minerals Tabs tablet Take  1 tablet by mouth daily.   naloxone 4 MG/0.1ML Liqd nasal spray kit Commonly known as: NARCAN SPRAY 1 SPRAY INTO ONE NOSTRIL AS DIRECTED FOR OPIOID OVERDOSE - CALL 911 IMMEDIATELY, ADMINISTER DOSE, THEN TURN PERSON ON SIDE - IF NO RESPONSE IN 2-3 MINUTES OR PERSON RESPONDS BUT RELAPSES, REPEAT USING A NEW SPRAY DEVICE AND SPRAY INTO THE OTHER NOSTRIL   omeprazole 20 MG capsule Commonly known as: PRILOSEC Take 20 mg by mouth as needed.   sertraline 100 MG tablet Commonly known as: ZOLOFT Take 100 mg by mouth as needed.   thiamine 100 MG tablet Commonly known as: Vitamin B-1 Take 1 tablet (100 mg total) by mouth daily.   traZODone 50 MG tablet Commonly known as: DESYREL Take 50 mg by mouth at bedtime as needed.        Follow-up Information     [facility] Follow up.   Specialty: General Practice Contact information: [address] [place] [address] [phone]                No Known Allergies    Procedures/Studies: US Abdomen Limited RUQ (LIVER/GB)  Result Date: 06/10/2023 CLINICAL DATA:  Acute pancreatitis. EXAM: ULTRASOUND ABDOMEN LIMITED RIGHT UPPER QUADRANT COMPARISON:  CT with IV contrast yesterday. FINDINGS: Gallbladder: No gallstones or wall thickening visualized. No sonographic Murphy sign noted by sonographer. Common bile duct: Diameter: 4.5 mm with no intrahepatic biliary prominence. Liver: No focal lesion identified. The liver is diffusely echogenic consistent with the moderate to severe fatty replacement noted on CT. Portal vein is patent on color Doppler imaging with normal direction of blood flow towards the liver. Other: None. IMPRESSION: No gallstones or biliary dilatation. Fatty liver. Electronically Signed   By: Almira Bar M.D.   On: 06/10/2023 00:35   CT ABDOMEN PELVIS W CONTRAST  Result Date: 06/09/2023 CLINICAL DATA:  Generalized abdominal pain with vomiting. Diagnosed with H pylori 2022. EXAM: CT ABDOMEN AND PELVIS WITH  CONTRAST TECHNIQUE: Multidetector CT imaging of the abdomen and pelvis was performed using the standard protocol following bolus administration of intravenous contrast. RADIATION DOSE REDUCTION: This exam was performed according to the departmental dose-optimization program which includes automated exposure control, adjustment of the mA and/or kV according to patient size and/or use of iterative reconstruction technique. CONTRAST:  OMNIPAQUE IOHEXOL 300 MG/ML  SOLN COMPARISON:  Abdominal ultrasound 04/16/2021 and CT abdomen and pelvis 09/24/2022 FINDINGS: Lower chest: No acute abnormality. Hepatobiliary: Marked hepatic steatosis. Normal gallbladder. No biliary dilation. Pancreas: Hazy fat stranding about the pancreatic head which is mildly edematous. No evidence of pancreatic necrosis. No ductal dilation. No organized fluid collection. Spleen: Unremarkable. Adrenals/Urinary Tract: Stable adrenal glands and kidneys. No obstructing urinary calculi or hydronephrosis. Unremarkable bladder. Stomach/Bowel: Normal caliber large and small bowel. Mild wall thickening about the duodenum. The appendix is normal.Stomach is within normal limits. Vascular/Lymphatic: Aortic atherosclerosis. No enlarged abdominal or pelvic lymph nodes. Reproductive: Unremarkable. Other: No  free intraperitoneal air. Musculoskeletal: No acute fracture. IMPRESSION: 1. Acute uncomplicated interstitial pancreatitis. 2. Marked hepatic steatosis. Aortic Atherosclerosis (ICD10-I70.0). Electronically Signed   By: Minerva Fester M.D.   On: 06/09/2023 22:20      Discharge Exam: Vitals:   06/11/23 0045 06/11/23 0639  BP: (!) 157/96 (!) 156/108  Pulse: 86 95  Resp: 18 20  Temp: 98.6 F (37 C) 98.4 F (36.9 C)  SpO2: 100% 99%    General: Pt is alert, awake, not in acute distress Cardiovascular: RRR, S1/S2 +, no edema Respiratory: CTA bilaterally, no wheezing, no rhonchi, no respiratory distress, no conversational dyspnea  Abdominal:  Soft, NT, ND, bowel sounds + Extremities: no edema, no cyanosis Psych: Normal mood and affect, stable judgement and insight     The results of significant diagnostics from this hospitalization (including imaging, microbiology, ancillary and laboratory) are listed below for reference.     Microbiology: No results found for this or any previous visit (from the past 240 hour(s)).   Labs: BNP (last 3 results) No results for input(s): "BNP" in the last 8760 hours. Basic Metabolic Panel: Recent Labs  Lab 06/09/23 2017 06/10/23 0853 06/11/23 0526  NA 132* 132* 129*  K 3.7 3.9 3.6  CL 94* 97* 91*  CO2 21* 24 26  GLUCOSE 167* 108* 107*  BUN 8 10 11   CREATININE 1.07 0.98 0.99  CALCIUM 9.6 9.2 9.2  MG  --  2.1  --    Liver Function Tests: Recent Labs  Lab 06/09/23 2017 06/10/23 0853 06/11/23 0526  AST 112* 73* 47*  ALT 120* 97* 68*  ALKPHOS 58 52 45  BILITOT 0.8 1.0 1.3*  PROT 7.7 7.4 6.5  ALBUMIN 4.5 4.0 3.6   Recent Labs  Lab 06/09/23 2017  LIPASE 9,380*   No results for input(s): "AMMONIA" in the last 168 hours. CBC: Recent Labs  Lab 06/09/23 2017 06/10/23 0853  WBC 6.5 5.1  HGB 15.1 14.6  HCT 45.2 43.8  MCV 91.5 90.9  PLT 141* 144*   Cardiac Enzymes: No results for input(s): "CKTOTAL", "CKMB", "CKMBINDEX", "TROPONINI" in the last 168 hours. BNP: Invalid input(s): "POCBNP" CBG: No results for input(s): "GLUCAP" in the last 168 hours. D-Dimer No results for input(s): "DDIMER" in the last 72 hours. Hgb A1c No results for input(s): "HGBA1C" in the last 72 hours. Lipid Profile Recent Labs    06/10/23 0853  TRIG 60   Thyroid function studies No results for input(s): "TSH", "T4TOTAL", "T3FREE", "THYROIDAB" in the last 72 hours.  Invalid input(s): "FREET3" Anemia work up No results for input(s): "VITAMINB12", "FOLATE", "FERRITIN", "TIBC", "IRON", "RETICCTPCT" in the last 72 hours. Urinalysis    Component Value Date/Time   COLORURINE YELLOW  06/09/2023 2002   APPEARANCEUR CLEAR 06/09/2023 2002   LABSPEC 1.019 06/09/2023 2002   PHURINE 5.0 06/09/2023 2002   GLUCOSEU 150 (A) 06/09/2023 2002   HGBUR MODERATE (A) 06/09/2023 2002   BILIRUBINUR NEGATIVE 06/09/2023 2002   KETONESUR 20 (A) 06/09/2023 2002   PROTEINUR 100 (A) 06/09/2023 2002   UROBILINOGEN 1.0 02/25/2013 0935   NITRITE NEGATIVE 06/09/2023 2002   LEUKOCYTESUR NEGATIVE 06/09/2023 2002   Sepsis Labs Recent Labs  Lab 06/09/23 2017 06/10/23 0853  WBC 6.5 5.1   Microbiology No results found for this or any previous visit (from the past 240 hour(s)).   Patient was seen and examined on the day of discharge and was found to be in stable condition. Time coordinating discharge: 25 minutes including assessment and  coordination of care, as well as examination of the patient.   SIGNED:  Noralee Stain, DO [facility] 06/11/2023, 8:51 AM

## 2023-06-16 NOTE — Progress Notes (Signed)
Patient was recently admitted prior to his brachytherapy.  Brachytherapy was cancelled due to hospital admission.  RN notified to have procedure rescheduled.  Pending date at this time.  Will continue to follow.

## 2023-06-17 ENCOUNTER — Other Ambulatory Visit: Payer: Self-pay | Admitting: Urology

## 2023-08-12 NOTE — Progress Notes (Signed)
RN left message for call back to assess any needs or questions prior to upcoming brachytherapy on 12/20.

## 2023-08-22 ENCOUNTER — Encounter (HOSPITAL_BASED_OUTPATIENT_CLINIC_OR_DEPARTMENT_OTHER): Payer: Self-pay | Admitting: Urology

## 2023-08-22 NOTE — Progress Notes (Addendum)
Spoke w/ via phone for pre-op interview--- pt Lab needs dos----  [employer], urine drug screen       Lab results------  current EKG in epic/ chart  COVID test -----patient states asymptomatic no test needed Arrive at -------  1015 on 08-26-2023 NPO after MN NO Solid Food.  Clear liquids from MN until--- 0915 Med rec completed Medications to take morning of surgery ----- norvasc, clonidine, prilosec Diabetic medication ----- n/a Patient instructed no nail polish to be worn day of surgery Patient instructed to bring photo id and insurance card day of surgery Patient aware to have Driver (ride ) / caregiver    for 24 hours after surgery - sister, lana Patient Special Instructions ----- will one fleet enema night prior to surgery  Pre-Op special Instructions -----   Patient verbalized understanding of instructions that were given at this phone interview. Patient denies chest pain, sob, fever, cough at the interview.   Pt was scheduled on 06-10-2023 for this same procedure , patient had called 06-09-2023 1654 spoke with me stated he has cold symptoms and abdominal pain had called Dr Marlou Porch office about rescheduling.  Noted patient admitted in hospital for alcohol induced acute pancreatitis. Pt stated last alcohol 06-09-2023 day of admission but stated last crack cocaine approx two weeks ago, working on quitting.  [facility] 07-15-2023 printed copy for chart, has last labs with normal LFTs)

## 2023-08-25 ENCOUNTER — Telehealth: Payer: Self-pay | Admitting: *Deleted

## 2023-08-25 NOTE — Telephone Encounter (Signed)
Called patient to remind of procedure for 08-26-23, spoke with patient's daughterDanielle Dess and she is aware of this procedure

## 2023-08-25 NOTE — Progress Notes (Signed)
Radiation Oncology         [phone] ________________________________  Name: Arthur Craig MRN: [id number]  Date: 08/26/2023  DOB: Jun 07, 1961       Prostate Seed Implant  YN:[facility]  Crist Fat, MD  DIAGNOSIS: 62 y.o. gentleman with Stage T1c adenocarcinoma of the prostate with Gleason score of 3+4, and PSA of 8.6.   Oncology History  Malignant neoplasm of prostate (HCC)  10/20/2022 Cancer Staging   Staging form: Prostate, AJCC 8th Edition - Clinical stage from 10/20/2022: Stage IIB (cT1c, cN0, cM0, PSA: 8.6, Grade Group: 2) - Signed by Marcello Fennel, PA-C on 03/18/2023 Histopathologic type: Adenocarcinoma, NOS Stage prefix: Initial diagnosis Prostate specific antigen (PSA) range: Less than 10 Gleason primary pattern: 3 Gleason secondary pattern: 4 Gleason score: 7 Histologic grading system: 5 grade system Number of biopsy cores examined: 19 Number of biopsy cores positive: 2 Location of positive needle core biopsies: One side   12/07/2022 Initial Diagnosis   Malignant neoplasm of prostate (HCC)       ICD-10-CM   1. Pre-op testing  Z01.818 CBG per Guidelines for Diabetes Management for Patients Undergoing Surgery ([facility], [facility], and [facility] only)    CBG per protocol    I-Stat, Chem 8 on day of surgery per protocol    Rapid Urine Drug Screen (hospital performed - Not at [facility])      PROCEDURE: Insertion of radioactive I-125 seeds into the prostate gland.  RADIATION DOSE: 145 Gy, definitive therapy.  TECHNIQUE: Arthur Craig was brought to the operating room with the urologist. He was placed in the dorsolithotomy position. He was catheterized and a rectal tube was inserted. The perineum was shaved, prepped and draped. The ultrasound probe was then introduced by me into the rectum to see the prostate gland.  TREATMENT DEVICE: I attached the needle grid to the ultrasound probe stand and anchor needles were placed.  3D PLANNING: The prostate was imaged in 3D  using a sagittal sweep of the prostate probe. These images were transferred to the planning computer. There, the prostate, urethra and rectum were defined on each axial reconstructed image. Then, the software created an optimized 3D plan and a few seed positions were adjusted. The quality of the plan was reviewed using [facility] information for the target and the following two organs at risk:  Urethra and Rectum.  Then the accepted plan was printed and handed off to the radiation therapist.  Under my supervision, the custom loading of the seeds and spacers was carried out using the quick loader.  These pre-loaded needles were then placed into the needle holder.Marland Kitchen  PROSTATE VOLUME STUDY:  Using transrectal ultrasound the volume of the prostate was verified to be 28.6 cc.  SPECIAL TREATMENT PROCEDURE/SUPERVISION AND HANDLING: The pre-loaded needles were then delivered by the urologist under sagittal guidance. A total of 17 needles were used to deposit 56 seeds in the prostate gland. The individual seed activity was 0.395 mCi.  SpaceOAR:  Yes  COMPLEX SIMULATION: At the end of the procedure, an anterior radiograph of the pelvis was obtained to document seed positioning and count. Cystoscopy was performed by the urologist to check the urethra and bladder.  MICRODOSIMETRY: At the end of the procedure, the patient was emitting 0.098 mR/hr at 1 meter. Accordingly, he was considered safe for hospital discharge.  PLAN: The patient will return to the radiation oncology clinic for post implant CT dosimetry in three weeks.   ________________________________  Artist Pais Kathrynn Running, M.D.

## 2023-08-26 ENCOUNTER — Encounter (HOSPITAL_BASED_OUTPATIENT_CLINIC_OR_DEPARTMENT_OTHER)
Admission: RE | Disposition: A | Discharge status: Home / Self Care | Payer: Self-pay | Source: Home / Self Care | Attending: Urology

## 2023-08-26 ENCOUNTER — Ambulatory Visit (HOSPITAL_BASED_OUTPATIENT_CLINIC_OR_DEPARTMENT_OTHER): Payer: No Typology Code available for payment source | Admitting: Anesthesiology

## 2023-08-26 ENCOUNTER — Ambulatory Visit (HOSPITAL_BASED_OUTPATIENT_CLINIC_OR_DEPARTMENT_OTHER)
Admission: RE | Admit: 2023-08-26 | Discharge: 2023-08-26 | Disposition: A | Discharge status: Home / Self Care | Payer: No Typology Code available for payment source | Attending: Urology | Admitting: Urology

## 2023-08-26 ENCOUNTER — Encounter (HOSPITAL_BASED_OUTPATIENT_CLINIC_OR_DEPARTMENT_OTHER): Payer: Self-pay | Admitting: Urology

## 2023-08-26 ENCOUNTER — Ambulatory Visit (HOSPITAL_COMMUNITY): Payer: No Typology Code available for payment source

## 2023-08-26 ENCOUNTER — Other Ambulatory Visit: Payer: Self-pay

## 2023-08-26 DIAGNOSIS — I10 Essential (primary) hypertension: Secondary | ICD-10-CM | POA: Insufficient documentation

## 2023-08-26 DIAGNOSIS — E785 Hyperlipidemia, unspecified: Secondary | ICD-10-CM

## 2023-08-26 DIAGNOSIS — K449 Diaphragmatic hernia without obstruction or gangrene: Secondary | ICD-10-CM | POA: Diagnosis not present

## 2023-08-26 DIAGNOSIS — F419 Anxiety disorder, unspecified: Secondary | ICD-10-CM | POA: Insufficient documentation

## 2023-08-26 DIAGNOSIS — F17210 Nicotine dependence, cigarettes, uncomplicated: Secondary | ICD-10-CM

## 2023-08-26 DIAGNOSIS — K279 Peptic ulcer, site unspecified, unspecified as acute or chronic, without hemorrhage or perforation: Secondary | ICD-10-CM | POA: Insufficient documentation

## 2023-08-26 DIAGNOSIS — M1990 Unspecified osteoarthritis, unspecified site: Secondary | ICD-10-CM | POA: Diagnosis not present

## 2023-08-26 DIAGNOSIS — F32A Depression, unspecified: Secondary | ICD-10-CM | POA: Insufficient documentation

## 2023-08-26 DIAGNOSIS — K219 Gastro-esophageal reflux disease without esophagitis: Secondary | ICD-10-CM | POA: Insufficient documentation

## 2023-08-26 DIAGNOSIS — C61 Malignant neoplasm of prostate: Secondary | ICD-10-CM

## 2023-08-26 DIAGNOSIS — F17200 Nicotine dependence, unspecified, uncomplicated: Secondary | ICD-10-CM | POA: Insufficient documentation

## 2023-08-26 DIAGNOSIS — Z01818 Encounter for other preprocedural examination: Secondary | ICD-10-CM

## 2023-08-26 HISTORY — PX: RADIOACTIVE SEED IMPLANT: SHX5150

## 2023-08-26 HISTORY — PX: SPACE OAR INSTILLATION: SHX6769

## 2023-08-26 HISTORY — DX: Iron deficiency anemia, unspecified: D50.9

## 2023-08-26 HISTORY — DX: Major depressive disorder, single episode, unspecified: F32.9

## 2023-08-26 HISTORY — DX: Generalized anxiety disorder: F41.1

## 2023-08-26 HISTORY — DX: Alcoholic fatty liver: K70.0

## 2023-08-26 HISTORY — DX: Personal history of adenomatous and serrated colon polyps: Z86.0101

## 2023-08-26 HISTORY — DX: Personal history of other infectious and parasitic diseases: Z86.19

## 2023-08-26 HISTORY — DX: Diaphragmatic hernia without obstruction or gangrene: K44.9

## 2023-08-26 HISTORY — DX: Cocaine abuse, uncomplicated: F14.10

## 2023-08-26 HISTORY — DX: Personal history of other diseases of the musculoskeletal system and connective tissue: Z87.39

## 2023-08-26 HISTORY — DX: Unspecified osteoarthritis, unspecified site: M19.90

## 2023-08-26 HISTORY — DX: Post-traumatic stress disorder, chronic: F43.12

## 2023-08-26 LAB — POCT I-STAT, CHEM 8
BUN: 21 mg/dL (ref 8–23)
Calcium, Ion: 1.3 mmol/L (ref 1.15–1.40)
Chloride: 100 mmol/L (ref 98–111)
Creatinine, Ser: 1.1 mg/dL (ref 0.61–1.24)
Glucose, Bld: 89 mg/dL (ref 70–99)
HCT: 42 % (ref 39.0–52.0)
Hemoglobin: 14.3 g/dL (ref 13.0–17.0)
Potassium: 4.4 mmol/L (ref 3.5–5.1)
Sodium: 140 mmol/L (ref 135–145)
TCO2: 31 mmol/L (ref 22–32)

## 2023-08-26 LAB — RAPID URINE DRUG SCREEN, HOSP PERFORMED
Amphetamines: NOT DETECTED
Barbiturates: NOT DETECTED
Benzodiazepines: NOT DETECTED
Cocaine: NOT DETECTED
Opiates: NOT DETECTED
Tetrahydrocannabinol: NOT DETECTED

## 2023-08-26 SURGERY — INSERTION, RADIATION SOURCE, PROSTATE
Anesthesia: General | Site: Perineum

## 2023-08-26 MED ORDER — MIDAZOLAM HCL 2 MG/2ML IJ SOLN
INTRAMUSCULAR | Status: DC | PRN
  Administered 2023-08-26: 2 mg via INTRAVENOUS

## 2023-08-26 MED ORDER — FLEET ENEMA RE ENEM: 1.0000 | ENEMA | Freq: Once | RECTAL | Status: DC

## 2023-08-26 MED ORDER — AMISULPRIDE (ANTIEMETIC) 5 MG/2ML IV SOLN: 10.0000 mg | Freq: Once | INTRAVENOUS | Status: DC | PRN

## 2023-08-26 MED ORDER — ONDANSETRON HCL 4 MG/2ML IJ SOLN: 4.0000 mg | Freq: Once | INTRAMUSCULAR | Status: DC | PRN

## 2023-08-26 MED ORDER — CIPROFLOXACIN IN D5W 400 MG/200ML IV SOLN
INTRAVENOUS | Status: AC
  Filled 2023-08-26: qty 200

## 2023-08-26 MED ORDER — IOHEXOL 300 MG/ML  SOLN
INTRAMUSCULAR | Status: DC | PRN
  Administered 2023-08-26: 7 mL

## 2023-08-26 MED ORDER — ONDANSETRON HCL 4 MG/2ML IJ SOLN
INTRAMUSCULAR | Status: DC | PRN
  Administered 2023-08-26: 4 mg via INTRAVENOUS

## 2023-08-26 MED ORDER — OXYCODONE HCL 5 MG/5ML PO SOLN: 5.0000 mg | Freq: Once | ORAL | Status: DC | PRN

## 2023-08-26 MED ORDER — HYDROMORPHONE HCL 1 MG/ML IJ SOLN: 0.2500 mg | INTRAMUSCULAR | Status: DC | PRN

## 2023-08-26 MED ORDER — ACETAMINOPHEN 500 MG PO TABS
1000.0000 mg | ORAL_TABLET | Freq: Once | ORAL | Status: AC
Start: 2023-08-26 — End: 2023-08-26
  Administered 2023-08-26: 1000 mg via ORAL

## 2023-08-26 MED ORDER — FENTANYL CITRATE (PF) 100 MCG/2ML IJ SOLN
INTRAMUSCULAR | Status: AC
  Filled 2023-08-26: qty 2

## 2023-08-26 MED ORDER — DEXMEDETOMIDINE HCL IN NACL 80 MCG/20ML IV SOLN
INTRAVENOUS | Status: DC | PRN
  Administered 2023-08-26 (×2): 4 ug via INTRAVENOUS

## 2023-08-26 MED ORDER — DEXAMETHASONE SODIUM PHOSPHATE 4 MG/ML IJ SOLN
INTRAMUSCULAR | Status: DC | PRN
  Administered 2023-08-26: 8 mg via INTRAVENOUS

## 2023-08-26 MED ORDER — FENTANYL CITRATE (PF) 100 MCG/2ML IJ SOLN
INTRAMUSCULAR | Status: DC | PRN
  Administered 2023-08-26 (×2): 50 ug via INTRAVENOUS

## 2023-08-26 MED ORDER — SUGAMMADEX SODIUM 200 MG/2ML IV SOLN
INTRAVENOUS | Status: DC | PRN
  Administered 2023-08-26: 200 mg via INTRAVENOUS

## 2023-08-26 MED ORDER — TRAMADOL HCL 50 MG PO TABS: 50.0000 mg | ORAL_TABLET | Freq: Four times a day (QID) | ORAL | 0 refills | Status: DC | PRN

## 2023-08-26 MED ORDER — SODIUM CHLORIDE 0.9 % IV SOLN: INTRAVENOUS | Status: DC

## 2023-08-26 MED ORDER — PHENYLEPHRINE HCL (PRESSORS) 10 MG/ML IV SOLN
INTRAVENOUS | Status: DC | PRN
  Administered 2023-08-26 (×2): 80 ug via INTRAVENOUS

## 2023-08-26 MED ORDER — ROCURONIUM BROMIDE 100 MG/10ML IV SOLN
INTRAVENOUS | Status: DC | PRN
  Administered 2023-08-26: 70 mg via INTRAVENOUS

## 2023-08-26 MED ORDER — SODIUM CHLORIDE (PF) 0.9 % IJ SOLN
INTRAMUSCULAR | Status: DC | PRN
  Administered 2023-08-26: 3 mL

## 2023-08-26 MED ORDER — SODIUM CHLORIDE 0.9 % IR SOLN
Status: DC | PRN
  Administered 2023-08-26: 1000 mL

## 2023-08-26 MED ORDER — LIDOCAINE HCL (CARDIAC) PF 100 MG/5ML IV SOSY
PREFILLED_SYRINGE | INTRAVENOUS | Status: DC | PRN
  Administered 2023-08-26: 60 mg via INTRAVENOUS

## 2023-08-26 MED ORDER — MIDAZOLAM HCL 2 MG/2ML IJ SOLN
INTRAMUSCULAR | Status: AC
Start: 2023-08-26 — End: ?
  Filled 2023-08-26: qty 2

## 2023-08-26 MED ORDER — LACTATED RINGERS IV SOLN: INTRAVENOUS | Status: DC

## 2023-08-26 MED ORDER — EPHEDRINE SULFATE (PRESSORS) 50 MG/ML IJ SOLN
INTRAMUSCULAR | Status: DC | PRN
  Administered 2023-08-26 (×2): 5 mg via INTRAVENOUS

## 2023-08-26 MED ORDER — TAMSULOSIN HCL 0.4 MG PO CAPS: 0.4000 mg | ORAL_CAPSULE | Freq: Every day | ORAL | 5 refills | Status: AC

## 2023-08-26 MED ORDER — OXYCODONE HCL 5 MG PO TABS: 5.0000 mg | ORAL_TABLET | Freq: Once | ORAL | Status: DC | PRN

## 2023-08-26 MED ORDER — CIPROFLOXACIN IN D5W 400 MG/200ML IV SOLN
400.0000 mg | INTRAVENOUS | Status: AC
  Administered 2023-08-26: 400 mg via INTRAVENOUS

## 2023-08-26 MED ORDER — ACETAMINOPHEN 500 MG PO TABS
ORAL_TABLET | ORAL | Status: AC
  Filled 2023-08-26: qty 2

## 2023-08-26 MED ORDER — PROPOFOL 10 MG/ML IV BOLUS
INTRAVENOUS | Status: DC | PRN
  Administered 2023-08-26: 200 mg via INTRAVENOUS

## 2023-08-26 SURGICAL SUPPLY — 41 items
BAG URINE DRAIN 2000ML AR STRL (UROLOGICAL SUPPLIES) ×1 IMPLANT
BLADE CLIPPER SENSICLIP SURGIC (BLADE) ×1 IMPLANT
BrachySource I-125 Implant Seed IMPLANT
CATH FOLEY 2WAY SLVR 5CC 16FR (CATHETERS) ×1 IMPLANT
CATH ROBINSON RED A/P 16FR (CATHETERS) IMPLANT
CATH ROBINSON RED A/P 20FR (CATHETERS) ×1 IMPLANT
CLOTH BEACON ORANGE TIMEOUT ST (SAFETY) ×1 IMPLANT
CNTNR URN SCR LID CUP LEK RST (MISCELLANEOUS) ×1 IMPLANT
COVER BACK TABLE 60X90IN (DRAPES) ×1 IMPLANT
COVER MAYO STAND STRL (DRAPES) ×1 IMPLANT
DRSG TEGADERM 4X4.75 (GAUZE/BANDAGES/DRESSINGS) ×1 IMPLANT
DRSG TEGADERM 8X12 (GAUZE/BANDAGES/DRESSINGS) ×1 IMPLANT
GEL ULTRASOUND 20GR AQUASONIC (MISCELLANEOUS) ×2 IMPLANT
GLOVE BIO SURGEON STRL SZ 6.5 (GLOVE) IMPLANT
GLOVE BIO SURGEON STRL SZ7.5 (GLOVE) ×2 IMPLANT
GLOVE BIO SURGEON STRL SZ8 (GLOVE) IMPLANT
GLOVE BIOGEL PI IND STRL 6.5 (GLOVE) IMPLANT
GLOVE SURG ORTHO 8.5 STRL (GLOVE) ×1 IMPLANT
GOWN STRL REUS W/TWL XL LVL3 (GOWN DISPOSABLE) ×1 IMPLANT
GRID BRACH TEMP 18GA 2.8X3X.75 (MISCELLANEOUS) ×1 IMPLANT
HOLDER FOLEY CATH W/STRAP (MISCELLANEOUS) ×1 IMPLANT
IMPL SPACEOAR VUE SYSTEM (Spacer) ×1 IMPLANT
IMPLANT SPACEOAR VUE SYSTEM (Spacer) ×1 IMPLANT
IV NS 1000ML BAXH (IV SOLUTION) ×2 IMPLANT
KIT TURNOVER CYSTO (KITS) ×1 IMPLANT
NDL BRACHY 18G 5PK (NEEDLE) ×4 IMPLANT
NDL BRACHY 18G SINGLE (NEEDLE) IMPLANT
NDL PK MORGANSTERN STABILIZ (NEEDLE) ×1 IMPLANT
NEEDLE BRACHY 18G 5PK (NEEDLE) ×4
NEEDLE BRACHY 18G SINGLE (NEEDLE)
NEEDLE PK MORGANSTERN STABILIZ (NEEDLE) ×1
PACK CYSTO (CUSTOM PROCEDURE TRAY) ×1 IMPLANT
SHEATH ULTRASOUND LF (SHEATH) IMPLANT
SHEATH ULTRASOUND LTX NONSTRL (SHEATH) IMPLANT
SLEEVE SCD COMPRESS KNEE MED (STOCKING) ×1 IMPLANT
SUT BONE WAX W31G (SUTURE) IMPLANT
SYR 10ML LL (SYRINGE) ×1 IMPLANT
TOWEL OR 17X24 6PK STRL BLUE (TOWEL DISPOSABLE) ×1 IMPLANT
UNDERPAD 30X36 HEAVY ABSORB (UNDERPADS AND DIAPERS) ×2 IMPLANT
WATER STERILE IRR 3000ML UROMA (IV SOLUTION) IMPLANT
WATER STERILE IRR 500ML POUR (IV SOLUTION) ×1 IMPLANT

## 2023-08-26 NOTE — Anesthesia Postprocedure Evaluation (Signed)
Anesthesia Post Note  Patient: Arthur Craig  Procedure(s) Performed: RADIOACTIVE SEED IMPLANT/BRACHYTHERAPY IMPLANT (Perineum) SPACE OAR INSTILLATION (Perineum)     Patient location during evaluation: PACU Anesthesia Type: General Level of consciousness: awake and alert, oriented and patient cooperative Pain management: pain level controlled Vital Signs Assessment: post-procedure vital signs reviewed and stable Respiratory status: spontaneous breathing, nonlabored ventilation and respiratory function stable Cardiovascular status: blood pressure returned to baseline and stable Postop Assessment: no apparent nausea or vomiting Anesthetic complications: no   No notable events documented.  Last Vitals:  Vitals:   08/26/23 1400 08/26/23 1415  BP: 134/77 119/77  Pulse: 68 65  Resp: 14 10  Temp: (!) 36.3 C   SpO2: 100% 95%    Last Pain:  Vitals:   08/26/23 1400  TempSrc:   PainSc: 0-No pain                 Lannie Fields

## 2023-08-26 NOTE — Transfer of Care (Signed)
Immediate Anesthesia Transfer of Care Note  Patient: Arthur Craig  Procedure(s) Performed: RADIOACTIVE SEED IMPLANT/BRACHYTHERAPY IMPLANT (Perineum) SPACE OAR INSTILLATION (Perineum)  Patient Location: PACU  Anesthesia Type:General  Level of Consciousness: awake, alert , oriented, and patient cooperative  Airway & Oxygen Therapy: Patient Spontanous Breathing and Patient connected to nasal cannula oxygen  Post-op Assessment: Report given to RN and Post -op Vital signs reviewed and stable  Post vital signs: Reviewed and stable  Last Vitals:  Vitals Value Taken Time  BP 143/77 08/26/23 1340  Temp 36.3 C 08/26/23 1340  Pulse 74 08/26/23 1342  Resp 15 08/26/23 1342  SpO2 100 % 08/26/23 1342  Vitals shown include unfiled device data.  Last Pain:  Vitals:   08/26/23 1037  TempSrc: Oral  PainSc: 0-No pain      Patients Stated Pain Goal: 4 (08/26/23 1037)  Complications: No notable events documented.

## 2023-08-26 NOTE — Anesthesia Preprocedure Evaluation (Addendum)
Anesthesia Evaluation  Patient identified by MRN, date of birth, ID band Patient awake    Reviewed: Allergy & Precautions, NPO status , Patient's Chart, lab work & pertinent test results  Airway Mallampati: II  TM Distance: >3 FB Neck ROM: Full    Dental no notable dental hx.    Pulmonary Current Smoker   Pulmonary exam normal breath sounds clear to auscultation       Cardiovascular hypertension, Pt. on medications Normal cardiovascular exam Rhythm:Regular Rate:Normal     Neuro/Psych  PSYCHIATRIC DISORDERS Anxiety Depression    negative neurological ROS     GI/Hepatic hiatal hernia, PUD,GERD  Controlled,,(+)     substance abuse  alcohol use, cocaine use and marijuana useStates last alcohol and crack cocaine use 3 weeks ago   Endo/Other  negative endocrine ROS    Renal/GU negative Renal ROS  negative genitourinary   Musculoskeletal  (+) Arthritis , Osteoarthritis,    Abdominal   Peds  Hematology negative hematology ROS (+)   Anesthesia Other Findings   Reproductive/Obstetrics negative OB ROS                             Anesthesia Physical Anesthesia Plan  ASA: 3  Anesthesia Plan: General   Post-op Pain Management: Tylenol PO (pre-op)*   Induction: Intravenous  PONV Risk Score and Plan: Ondansetron, Dexamethasone, Treatment may vary due to age or medical condition and Midazolam  Airway Management Planned: Oral ETT  Additional Equipment: None  Intra-op Plan:   Post-operative Plan: Extubation in OR  Informed Consent: I have reviewed the patients History and Physical, chart, labs and discussed the procedure including the risks, benefits and alternatives for the proposed anesthesia with the patient or authorized representative who has indicated his/her understanding and acceptance.     Dental advisory given  Plan Discussed with: CRNA  Anesthesia Plan Comments:         Anesthesia Quick Evaluation

## 2023-08-26 NOTE — Discharge Instructions (Addendum)
Post Anesthesia Home Care Instructions  Activity: Get plenty of rest for the remainder of the day. A responsible individual must stay with you for 24 hours following the procedure.  For the next 24 hours, DO NOT: -Drive a car -Advertising copywriter -Drink alcoholic beverages -Take any medication unless instructed by your physician -Make any legal decisions or sign important papers.  Meals: Start with liquid foods such as gelatin or soup. Progress to regular foods as tolerated. Avoid greasy, spicy, heavy foods. If nausea and/or vomiting occur, drink only clear liquids until the nausea and/or vomiting subsides. Call your physician if vomiting continues.  Special Instructions/Symptoms: Your throat may feel dry or sore from the anesthesia or the breathing tube placed in your throat during surgery. If this causes discomfort, gargle with warm salt water. The discomfort should disappear within 24 hours.  May take Tylenol beginning at 5 PM as needed for soreness/discomfort.    DISCHARGE INSTRUCTIONS FOR PROSTATE SEED IMPLANTATION  Activity:    Rest for the remainder of the day.  Do not drive or operate equipment today.  You may resume normal  activities in a few days as instructed by your physician, without risk of harmful radiation exposure to those around you, provided you follow the time and distance precautions on the Radiation Oncology Instruction Sheet.   Meals: Drink plenty of lipuids and eat light foods, such as gelatin or soup this evening .  You may return to normal meal plan tomorrow.  Return To Work: You may return to work as instructed by Designer, multimedia.  Special Instruction:   If any seeds are found, use tweezers to pick up seeds and place in a glass container of any kind and bring to your physician's office.  Call your physician if any of these symptoms occur:  Persistent or heavy bleeding Urine stream diminishes or stops completely after catheter is removed Fever equal to or  greater than 101 degrees F Cloudy urine with a strong foul odor Severe pain  You may feel some burning pain and/or hesitancy when you urinate after the catheter is removed.  These symptoms may increase over the next few weeks, but should diminish within forur to six weeks.  Applying moist heat to the lower abdomen or a hot tub bath may help relieve the pain.  If the discomfort becomes severe, please call your physician for additional medications.    PROSTATE CANCER TREATMENT WITH RADIOACTIVE IODINE-125 SEED IMPLANT  This instruction sheet is intended to discuss implantation of Iodine-125 seeds as treatment for cancer of the prostate. It will explain in detail what you may expect from this treatment and what precautions are necessary as a result of the treatment. Iodine-125 emits a relatively low energy radiation. The radioactive seeds are surgically implanted directly into the prostate gland. Most of the radiation is contained within the prostate gland. A very small amount is present outside the body.The precautions that we ask you to take are to ensure that those around you are protected from unnecessary radiation. The principles of radiation safety that you need to understand are:  DISTANCE: The further a person is from the radioactive implant the less radiation they will be receiving. The amount of radiation received falls off quite rapidly with distance. More specific guidelines are given in the table on the last page.  TIME: The amount of radiation a person is exposed to is directly proportional to the amount of time that is spent in close proximity to the radioactive implant. Very little  radiation will be received during short periods. See the table on the last page for more specific guideline.  CHILDREN UNDER AGE 40 Children should not be allowed to sit on your lap or otherwise be in very close contact for more than a few minutes for the first 6-8 weeks following the implant. You may  affectionately greet (hug/kiss) a child for a short period of time, but remember, the longer you are in close proximity with that child the more radiation they are being exposed to. At a distance of 6 feet there is no limit to the length of time you may spend together. See specific guidelines on the last page.  PREGNANT OR POSSIBLY PREGNANT WOMEN Pregnant women should avoid prolonged close physical contact with you for the first 6-8 weeks after implant. At a distance of 6 feet there is no limit to the length of time you may spend together. Pregnant women or possibly pregnant women can safely be in close contact with you for a limited period of time. See the last page for guidelines.  FAMILY RELATIONS You may sleep in the same bed as your partner (provided she is not pregnant or under the age of 55). Sexual intercourse, using a condom, may be resumed 2 weeks after the implant. Your semen may be discolored, dark brown or black. This is normal and is the result of bleeding that may have occurred during the implant. After 3-4 weeks it will not be necessary to use a condom.  DAILY ACTIVITIES You may resume normal activities in a few days (example: work, shopping, church) without the risk of harmful radiation exposure to those around you provided you keep in mind the time and distance precautions. Objects that you touch or item that you use do not become radioactive. Linens, clothing, tableware, and dishes may be used by other persons without special precautions. Your bodily wastes (urine and stool) are not radioactive.  SPECIAL PRECAUTIONS It is possible to lose implanted Iodine-125 seed(s) through urination. Although it is possible to pass seeds indefinitely, it is most likely to occur immediately after catheter removal. To prevent this from happening the catheter that was in place during the implant procedure is removed immediately after the implant and a cystoscopy procedure is performed. The process of  removing the catheter and the cystoscopy procedure should dislodge and remove any seeds that are not firmly imbedded in the prostate tissue. However, you should watch for seeds if/when you remove your catheter at home. The seeds are silver colored and the size of a grain of rice. In the unlikely event that a seed is seen after urination, simply flush the seed down the toilet. The seed should not be handled with your fingers, not even with a glove or napkin. A spoon or tweezers can be used to pick up a seed. The Radiation Oncology department is open Monday - Friday from 8:00 am to 5:30 pm with a Radiation Oncologist on call at all times. He or she may be reached by calling [phone]. If you are to be hospitalized or if death should occur, your family should notify the Actor.  SIDE EFFECTS There are very few side effects associate with the implant procedure. Minor burning with urination, weak stream, hesitancy, intermittency, frequency, mild pain or feeling unable to pass your urine freely are common and usually stop in one to four months. If these symptoms are extremely uncomfortable, contact your physician.  RADIATION SAFETY GUIDELINES PROSTATE CANCER TREATMENT WITH RADIOACTIVE IODINE-125 SEED  IMPLANT  The following guidelines will limit exposure to less than naturally occurring background radiation.  PERSONS AGE 76-45 (if able to become pregnant)  FOR 8 WEEKS FOLLOWING IMPLANT  At a distance of 1 foot: limit time to less than 2 hours/week At a distance of 3 feet: limit time to 20 hours/week At a distance of 6 feet: no restrictions  AFTER 8 WEEKS No restrictions  CHILDREN UNDER AGE 76, PREGNANT WOMEN OR POSSIBLY PREGNANT WOMEN  FOR 8 WEEKS FOLLOWING IMPLANT At a distance of 1 foot: limit time to 10 minutes/week At a distance of 3 feet: limit time to 2 hours/week At a distance of 6 feet: no restrictions  AFTER 8 WEEKS No restrictions  PERSONS OVER THE AGE OF 45  AND DO NOT EXPECT TO HAVE ANY MORE CHILDREN No restrictions  Updated by SCP in January 2020

## 2023-08-26 NOTE — H&P (Signed)
AUA Questions Scoring.  HPI: Arthur Craig is a 62 year-old male established patient who is here for evaluation.      CC/HPI: Prostate cancer   Arthur Craig is a 62 year old male with T1c, Gleason 3+4 prostate cancer [facility]   PSA diagnosis: 8.6  Prostate volume: 36 cm  Prostate biopsy revealed 2 out of 19 cores positive for grade 2 prostate cancer  Prostate MRI showed 2 PI-RADS 4 lesions with no evidence of lymphadenopathy or pelvic/skeletal metastasis  Perineural invasion was noted within the 2 positive cores.   From a urinary standpoint, the patient reports a weak force of stream along with urinary urgency and frequency every 1-2 hours. Nocturia X 3-4. He denies any prior GU surgery/trauma or issues with UTIs. He underwent a cystoscopy at the time of his prostate biopsy, which was negative for any intravesical lesions.   Interval: Today the patient is here for discussion of radical prostatectomy. However, he has met with Dr. Kathrynn Running already and is opted to pursue radiation. He is not interested in discussing radical prostatectomy. He is more interested in discussing brachytherapy. We reviewed his voiding symptoms which are of moderate severity, and at his last visit he was prescribed tamsulosin. He has not started yet so he does not know the impact of it. The patient does have a fair amount of liquid consumption throughout the day, the majority of this being beer.     AUA Symptom Score: 50% of the time he has the sensation of not emptying his bladder completely when finished urinating. Almost always he has to urinate again fewer than two hours after he has finished urinating. 50% of the time he has to start and stop again several times when he urinates. More than 50% of the time he finds it difficult to postpone urination. Less than 50% of the time he has a weak urinary stream. 50% of the time he has to push or strain to begin urination. He has to get up to  urinate 4 times from the time he goes to bed until the time he gets up in the morning.   Calculated AUA Symptom Score: 24    ALLERGIES: No Allergies    MEDICATIONS: Hydrochlorothiazide  Lisinopril  Omeprazole 1 PO Daily PRN  Tamsulosin Hcl 0.4 mg capsule 1 capsule PO Daily  Amlodipine Besylate  Clonidine  Naltrexone Hcl 1 PO Daily PRN  Trazodone Hcl 1 PO Daily PRN  Vitamin B12  Zoloft 1 PO Daily PRN     GU PSH: No GU PSH      PSH Notes: Right Index Finger   NON-GU PSH: No Non-GU PSH    GU PMH: Prostate Cancer - 12/10/2022    NON-GU PMH: Anxiety Arthritis Depression Duodenal ulcer, unspecified as acute or chronic, without hemorrhage or perforation Gout Hypertension    FAMILY HISTORY: 1 Daughter - No Family History 1 son - No Family History Cancer - Father father deceased at age 82 - Runs in Family   SOCIAL HISTORY: Marital Status: Single Current Smoking Status: Patient smokes. Smokes 1/2 pack per day.   Tobacco Use Assessment Completed: Used Tobacco in last 30 days? Drinks 6 drinks per day. Types of alcohol consumed: Beer.  Does not drink caffeine. Patient's occupation Geographical information systems officer.    REVIEW OF SYSTEMS:    GU Review Male:   Patient denies frequent urination, hard to postpone urination, burning/ pain with urination, get up at night to urinate, leakage of urine,  stream starts and stops, trouble starting your stream, have to strain to urinate , erection problems, and penile pain.  Gastrointestinal (Upper):   Patient denies nausea, vomiting, and indigestion/ heartburn.  Gastrointestinal (Lower):   Patient denies diarrhea and constipation.  Constitutional:   Patient denies fever, night sweats, weight loss, and fatigue.  Skin:   Patient denies skin rash/ lesion and itching.  Eyes:   Patient denies blurred vision and double vision.  Ears/ Nose/ Throat:   Patient denies sinus problems and sore throat.  Hematologic/Lymphatic:   Patient denies swollen glands and easy  bruising.  Cardiovascular:   Patient denies leg swelling and chest pains.  Respiratory:   Patient denies cough and shortness of breath.  Endocrine:   Patient denies excessive thirst.  Musculoskeletal:   Patient denies back pain and joint pain.  Neurological:   Patient denies headaches and dizziness.  Psychologic:   Patient denies depression and anxiety.   VITAL SIGNS: None   Complexity of Data:  Source Of History:  Patient  Lab Test Review:   PSA  Records Review:   Pathology Reports, Previous Doctor Records, Previous Patient Records, POC Tool  Urine Test Review:   Urinalysis   PROCEDURES: None   ASSESSMENT:      ICD-10 Details  1 GU:   Prostate Cancer - C61      PLAN:           Document Letter(s):  Created for Patient: Clinical Summary         Notes:   I had a long discussion with the patient about the risk and benefits of surgery. He has decided not to pursue radical prostatectomy, instead he would like to pursue brachytherapy. I think his voiding symptoms are of concern, and I encouraged him to take the tamsulosin and see how he improves. I encouraged him to follow-up with radiation oncology for further discussion and formulation of a definitive plan.   I did tell the patient I be available to facilitate any treatment plan whether it be radiation seed implant or SpaceOAR and gold seed implant prior to external beam radiation.

## 2023-08-26 NOTE — Op Note (Signed)
Preoperative diagnosis:  Clinical stage TI C adenocarcinoma the prostate  Postoperative diagnosis:  Same  Procedure:  #1 I-125 prostate seed implantation  #2 cystourethroscopy #3 instillation of SpaceOAR biogel  Surgeon: Berniece Salines, M.D. Radiation Oncologist: Margaretmary Dys, M.D.  Anesthesia: Gen.   Indications: Patient  was diagnosed with clinical stage TI C prostate cancer. We had extensive discussion with him about treatment options versus. He elected to proceed with seed implantation. He underwent consultation my office as well as with Dr. Margaretmary Dys. He appeared to understand the advantages disadvantages potential risks of this treatment option. Full informed consent has been obtained. The patient is had preoperative ciprofloxacin. PAS compression boots were placed.   Technique and findings: Patient was brought the operating room where he had  successful induction of general anesthesia. He was placed in lithotomy position and prepped and draped in usual manner. Appropriate surgical timeout was performed. Radiation oncology department placed a transrectal ultrasound probe anchoring stand. Foley catheter with contrast in the balloon was inserted without difficulty. Anchoring needles were placed within the prostate.  Real-time contouring of the urethra prostate and rectum were performed and the dosing parameters were established. Targeted dose was 145 gray. We then came to the operating suite suite for placement of the needles. A second timeout was performed. All needle passage was done with real-time transrectal ultrasound guidance with the sagittal plane. A total of 17 needles were placed.  56 active seeds were implanted.  The brachytherapy template was then removed.    A site in the midline was selected on the perineum for placement of an 18 g needle with saline.  The needle was advanced above the rectum and below Denonvillier's fascia to the mid gland and confirmed to be in the  midline on transverse imaging.  One cc of saline was injected confirming appropriate expansion of this space.  A total of 5 cc of saline was then injected to open the space further bilaterally.  The saline syringe was then removed and the SpaceOAR hydrogel was injected with good distribution bilaterally.A Foley catheter was removed and flexible cystoscopy failed to show any seeds outside the prostate.

## 2023-08-26 NOTE — Anesthesia Procedure Notes (Addendum)
Procedure Name: Intubation Date/Time: 08/26/2023 12:29 PM  Performed by: Earmon Phoenix, CRNAPre-anesthesia Checklist: Patient identified, Emergency Drugs available, Suction available, Patient being monitored and Timeout performed Patient Re-evaluated:Patient Re-evaluated prior to induction Oxygen Delivery Method: Circle system utilized Preoxygenation: Pre-oxygenation with 100% oxygen Induction Type: IV induction Ventilation: Mask ventilation without difficulty Laryngoscope Size: 4 Grade View: Grade I Tube type: Oral Tube size: 7.5 mm Number of attempts: 1 Airway Equipment and Method: Stylet Placement Confirmation: ETT inserted through vocal cords under direct vision, positive ETCO2 and breath sounds checked- equal and bilateral Secured at: 23 cm Tube secured with: Tape Dental Injury: Teeth and Oropharynx as per pre-operative assessment

## 2023-08-29 ENCOUNTER — Encounter (HOSPITAL_BASED_OUTPATIENT_CLINIC_OR_DEPARTMENT_OTHER): Payer: Self-pay | Admitting: Urology

## 2023-09-14 ENCOUNTER — Telehealth: Payer: Self-pay | Admitting: *Deleted

## 2023-09-14 NOTE — Progress Notes (Signed)
  Radiation Oncology         [phone] ________________________________  Name: MUAAD BOEHNING MRN: [id number]  Date: 09/15/2023  DOB: June 16, 1961  COMPLEX SIMULATION NOTE  NARRATIVE:  The patient was brought to the CT Simulation planning suite today following prostate seed implantation approximately one month ago.  Identity was confirmed.  All relevant records and images related to the planned course of therapy were reviewed.  Then, the patient was set-up supine.  CT images were obtained.  The CT images were loaded into the planning software.  Then the prostate and rectum were contoured.  Treatment planning then occurred.  The implanted iodine 125 seeds were identified by the physics staff for projection of radiation distribution  I have requested : 3D Simulation  I have requested a DVH of the following structures: Prostate and rectum.    ________________________________  Donnice FELIX Patrcia, M.D.

## 2023-09-14 NOTE — Telephone Encounter (Signed)
 Called patient to remind of post seed appts. for 09-15-23, spoke with patient and he is aware of these appts.

## 2023-09-14 NOTE — Progress Notes (Signed)
 Radiation Oncology         [phone] ________________________________  Name: Arthur Craig MRN: [id number]  Date: 09/15/2023  DOB: Feb 15, 1961  Post-Seed Follow-Up Visit Note  [facility]  Diagnosis:   63 y.o. gentleman with Stage T1c adenocarcinoma of the prostate with Gleason score of 3+4, and PSA of 8.6.     ICD-10-CM   1. Malignant neoplasm of prostate (HCC)  C61       Interval Since Last Radiation:  3 weeks 08/26/23:  Insertion of radioactive I-125 seeds into the prostate gland; 145 Gy, definitive therapy with placement of SpaceOAR gel.  Narrative:  The patient returns today for routine follow-up.  He is complaining of increased urinary frequency and urinary hesitation symptoms. He filled out a questionnaire regarding urinary function today providing and overall IPSS score of 23 characterizing his symptoms as severe but tolerable. He has been taking Flomax  as prescribed although he did stop it for 4 days thinking this was causing constipation and has since resumed it with improved voiding. We discussed that the constipation was more likely associated with the Tramadol  pain medication and that he can use Miralax daily prn constipation while taking pain medication.  His pre-implant score was 29- but he admitted that he was non-compliant with the Flomax . He denies currently any abdominal pain or bowel symptoms. Energy level is only mildly decreased and overall, he is pleased with his progress to date.  ALLERGIES:  has no known allergies.  Meds: Current Outpatient Medications  Medication Sig Dispense Refill   amLODipine  (NORVASC ) 10 MG tablet Take 10 mg by mouth daily.     cloNIDine  (CATAPRES ) 0.1 MG tablet Take 1 tablet (0.1 mg total) by mouth 2 (two) times daily. (Patient taking differently: Take 0.1 mg by mouth 2 (two) times daily.) 60 tablet 1   folic acid  (FOLVITE ) 1 MG tablet Take 1 tablet (1 mg total) by mouth daily. 30 tablet 1    hydrochlorothiazide  (HYDRODIURIL ) 25 MG tablet Take 25 mg by mouth daily.     lisinopril  (ZESTRIL ) 40 MG tablet Take 40 mg by mouth daily.     Multiple Vitamin (MULTIVITAMIN WITH MINERALS) TABS tablet Take 1 tablet by mouth daily.     naloxone (NARCAN) nasal spray 4 mg/0.1 mL Place into the nose as needed. Has not needed     omeprazole  (PRILOSEC) 20 MG capsule Take 20 mg by mouth as needed.     sertraline  (ZOLOFT ) 100 MG tablet Take 50 mg by mouth daily.     tamsulosin  (FLOMAX ) 0.4 MG CAPS capsule Take 1 capsule (0.4 mg total) by mouth daily. 30 capsule 5   thiamine  (VITAMIN B-1) 100 MG tablet Take 1 tablet (100 mg total) by mouth daily. (Patient not taking: Reported on 08/22/2023) 30 tablet 1   traMADol  (ULTRAM ) 50 MG tablet Take 1-2 tablets (50-100 mg total) by mouth every 6 (six) hours as needed for moderate pain (pain score 4-6). 15 tablet 0   traZODone  (DESYREL ) 50 MG tablet Take 50 mg by mouth at bedtime as needed for sleep.     No current facility-administered medications for this visit.    Physical Findings: In general this is a well appearing African American male in no acute distress. He's alert and oriented x4 and appropriate throughout the examination. Cardiopulmonary assessment is negative for acute distress and he exhibits normal effort.   Lab Findings: Lab Results  Component Value Date   WBC 5.1 06/10/2023  HGB 14.3 08/26/2023   HCT 42.0 08/26/2023   MCV 90.9 06/10/2023   PLT 144 (L) 06/10/2023    Radiographic Findings:  Patient underwent CT imaging in our clinic for post implant dosimetry. The CT will be reviewed by Dr. Patrcia to confirm there is an adequate distribution of radioactive seeds throughout the prostate gland and ensure that there are no seeds in or near the rectum.  We suspect the final radiation plan and dosimetry will show appropriate coverage of the prostate gland. He understands that we will call and inform him of any unexpected findings on further  review of his imaging and dosimetry.  Impression/Plan: 63 y.o. gentleman with Stage T1c adenocarcinoma of the prostate with Gleason score of 3+4, and PSA of 8.6.  The patient is recovering from the effects of radiation. His urinary symptoms should gradually improve over the next 4-6 months. We talked about this today. He is encouraged by his improvement already and is otherwise pleased with his outcome. We also talked about long-term follow-up for prostate cancer following seed implant. He understands that ongoing PSA determinations and digital rectal exams will help perform surveillance to rule out disease recurrence. He has a follow up appointment scheduled with Arthur Eagles, NP on 09/16/23 at 10:30am. He understands what to expect with his PSA measures. Patient was also educated today about some of the long-term effects from radiation including a small risk for rectal bleeding and possibly erectile dysfunction. We talked about some of the general management approaches to these potential complications. However, I did encourage the patient to contact our office or return at any point if he has questions or concerns related to his previous radiation and prostate cancer.    Arthur MICAEL Rusk, PA-C

## 2023-09-15 ENCOUNTER — Other Ambulatory Visit: Payer: Self-pay | Admitting: Urology

## 2023-09-15 ENCOUNTER — Ambulatory Visit
Admission: RE | Admit: 2023-09-15 | Discharge: 2023-09-15 | Disposition: A | Discharge status: Home / Self Care | Payer: No Typology Code available for payment source | Source: Ambulatory Visit | Attending: Urology | Admitting: Urology

## 2023-09-15 ENCOUNTER — Encounter: Payer: Self-pay | Admitting: Urology

## 2023-09-15 ENCOUNTER — Telehealth: Payer: Self-pay | Admitting: Radiation Oncology

## 2023-09-15 ENCOUNTER — Ambulatory Visit
Admission: RE | Admit: 2023-09-15 | Discharge: 2023-09-15 | Disposition: A | Discharge status: Home / Self Care | Payer: No Typology Code available for payment source | Source: Ambulatory Visit | Attending: Radiation Oncology | Admitting: Radiation Oncology

## 2023-09-15 VITALS — BP 139/89 | HR 79 | Temp 98.2°F | Resp 19 | Ht 70.0 in | Wt 182.0 lb

## 2023-09-15 VITALS — BP 139/89 | HR 79 | Temp 98.2°F | Resp 16 | Wt 182.2 lb

## 2023-09-15 DIAGNOSIS — C61 Malignant neoplasm of prostate: Secondary | ICD-10-CM | POA: Diagnosis present

## 2023-09-15 MED ORDER — TRAMADOL HCL 50 MG PO TABS: 50.0000 mg | ORAL_TABLET | Freq: Four times a day (QID) | ORAL | 0 refills | Status: AC | PRN

## 2023-09-15 NOTE — Progress Notes (Signed)
 Post-seed nursing interview for a diagnosis of Stage T1c adenocarcinoma of the prostate with Gleason score of 3+4, and PSA of 8.6.  Patient identity verified x2.   Patient reports moderate ED, nocturia x4, urinary urgency, dysuria 6/10. Patient is improving and denies any other related issues at this time.  Meaningful use complete.  I-PSS score- 23 - Severe SHIM score- 3-No sexual activity within the last 6 months. Urinary Management medication(s) Tamsulosin  (with 5 refills currently)  Patient's pharmacy has been recently changed to [facility] on [id number]  Urology appointment date- 09/16/2023 with Dr. Cam at [facility]  Vitals- BP 139/89 (BP Location: Left Arm, Patient Position: Sitting, Cuff Size: Normal)   Pulse 79   Temp 98.2 F (36.8 C) (Temporal)   Resp 19   Ht 5' 10 (1.778 m)   Wt 182 lb (82.6 kg)   SpO2 99%   BMI 26.11 kg/m   This concludes the interaction.  Rosaline Minerva, LPN

## 2023-09-15 NOTE — Telephone Encounter (Signed)
 1/9 @ 10:30 am Patient called due to his prescription for Tramadol was not in for pick up at [facility].  Secure chat sent to [facility], so they are aware.

## 2023-09-19 ENCOUNTER — Encounter: Payer: Self-pay | Admitting: *Deleted

## 2023-09-20 ENCOUNTER — Encounter: Payer: Self-pay | Admitting: Radiation Oncology

## 2023-09-20 ENCOUNTER — Ambulatory Visit
Admission: RE | Admit: 2023-09-20 | Discharge: 2023-09-20 | Disposition: A | Discharge status: Home / Self Care | Payer: No Typology Code available for payment source | Source: Ambulatory Visit | Attending: Radiation Oncology | Admitting: Radiation Oncology

## 2023-09-20 DIAGNOSIS — C61 Malignant neoplasm of prostate: Secondary | ICD-10-CM | POA: Insufficient documentation

## 2023-09-20 NOTE — Progress Notes (Signed)
  Radiation Oncology         [phone] ________________________________  Name: JHAMARI MARKOWICZ MRN: [id number]  Date: 09/20/2023  DOB: 12-20-1960  3D Planning Note   Prostate Brachytherapy Post-Implant Dosimetry  Diagnosis:  63 y.o. gentleman with Stage T1c adenocarcinoma of the prostate with Gleason score of 3+4, and PSA of 8.6.   Narrative: On a previous date, KONNER SAIZ returned following prostate seed implantation for post implant planning. He underwent CT scan complex simulation to delineate the three-dimensional structures of the pelvis and demonstrate the radiation distribution.  Since that time, the seed localization, and complex isodose planning with dose volume histograms have now been completed.  Results:   Prostate Coverage - The dose of radiation delivered to the 90% or more of the prostate gland (D90) was 116.7% of the prescription dose. This exceeds our goal of greater than 90%. Rectal Sparing - The volume of rectal tissue receiving the prescription dose or higher was 0.0 cc. This falls under our thresholds tolerance of 1.0 cc.  Impression: The prostate seed implant appears to show adequate target coverage and appropriate rectal sparing.  Plan:  The patient will continue to follow with urology for ongoing PSA determinations. I would anticipate a high likelihood for local tumor control with minimal risk for rectal morbidity.  ________________________________  Donnice FELIX Patrcia, M.D.

## 2023-09-20 NOTE — Radiation Completion Notes (Signed)
 Patient Name: KATLIN, CISZEWSKI MRN: [id number] Date of Birth: 11/02/1960 Referring Physician: GAILE CERISE, M.D. Date of Service: 2023-09-20 Radiation Oncologist: Adina Barge, M.D. [facility]                             RADIATION ONCOLOGY END OF TREATMENT NOTE     Diagnosis: C61 Malignant neoplasm of prostate Staging on 2022-10-20: Malignant neoplasm of prostate (HCC) T=cT1c, N=cN0, M=cM0 Intent: Curative     ==========DELIVERED PLANS==========  Prostate Seed Implant Date: 2023-08-26   Plan Name: Prostate Seed Implant Site: Prostate Technique: Radioactive Seed Implant I-125 Mode: Brachytherapy Dose Per Fraction: 145 Gy Prescribed Dose (Delivered / Prescribed): 145 Gy / 145 Gy Prescribed Fxs (Delivered / Prescribed): 1 / 1     ==========ON TREATMENT VISIT DATES========== 2023-08-26     ==========UPCOMING VISITS==========

## 2023-10-25 ENCOUNTER — Encounter: Payer: Self-pay | Admitting: *Deleted

## 2023-10-25 ENCOUNTER — Inpatient Hospital Stay: Payer: No Typology Code available for payment source | Attending: Adult Health | Admitting: *Deleted

## 2023-10-25 DIAGNOSIS — C61 Malignant neoplasm of prostate: Secondary | ICD-10-CM

## 2023-10-25 NOTE — Progress Notes (Signed)
SCP reviewed and completed. Pt will have post-tx PSA labs on 12/06/2023 and f/u with Dr. Marlou Porch on 12/13/2023. Last colonoscopy was 8/5/20222. Next due 04/10/2028. Vaccines discussed and updated. Pt is interested in the [place] program. I have mailed him a flyer out on today with information and contacted the head of the [place]  program.
